# Patient Record
Sex: Female | Born: 1980 | Race: White | Hispanic: No | Marital: Married | Smoking: Never smoker
Health system: Southern US, Community
[De-identification: ages and names within clinical notes are randomized; demographics above are authoritative.]

## PROBLEM LIST (undated history)

## (undated) DIAGNOSIS — L719 Rosacea, unspecified: Secondary | ICD-10-CM

## (undated) HISTORY — DX: Rosacea, unspecified: L71.9

---

## 2008-12-25 ENCOUNTER — Ambulatory Visit: Payer: Self-pay | Admitting: Internal Medicine

## 2014-11-23 ENCOUNTER — Other Ambulatory Visit: Payer: Self-pay | Admitting: Obstetrics and Gynecology

## 2014-11-23 DIAGNOSIS — Z1231 Encounter for screening mammogram for malignant neoplasm of breast: Secondary | ICD-10-CM

## 2014-12-21 ENCOUNTER — Ambulatory Visit: Payer: Self-pay | Attending: Obstetrics and Gynecology

## 2015-03-13 ENCOUNTER — Ambulatory Visit
Admission: RE | Admit: 2015-03-13 | Discharge: 2015-03-13 | Disposition: A | Discharge status: Home / Self Care | Payer: BLUE CROSS/BLUE SHIELD | Source: Ambulatory Visit | Attending: Obstetrics and Gynecology | Admitting: Obstetrics and Gynecology

## 2015-03-13 DIAGNOSIS — Z1231 Encounter for screening mammogram for malignant neoplasm of breast: Secondary | ICD-10-CM | POA: Insufficient documentation

## 2018-10-19 ENCOUNTER — Ambulatory Visit: Payer: Self-pay | Admitting: Podiatry

## 2019-02-16 ENCOUNTER — Encounter: Payer: Self-pay | Admitting: Emergency Medicine

## 2019-02-16 ENCOUNTER — Emergency Department
Admission: EM | Admit: 2019-02-16 | Discharge: 2019-02-16 | Disposition: A | Discharge status: Home / Self Care | Payer: BC Managed Care – PPO | Attending: Emergency Medicine | Admitting: Emergency Medicine

## 2019-02-16 ENCOUNTER — Other Ambulatory Visit: Payer: Self-pay

## 2019-02-16 DIAGNOSIS — Y999 Unspecified external cause status: Secondary | ICD-10-CM | POA: Insufficient documentation

## 2019-02-16 DIAGNOSIS — S61012A Laceration without foreign body of left thumb without damage to nail, initial encounter: Secondary | ICD-10-CM | POA: Diagnosis present

## 2019-02-16 DIAGNOSIS — Y929 Unspecified place or not applicable: Secondary | ICD-10-CM | POA: Insufficient documentation

## 2019-02-16 DIAGNOSIS — Z23 Encounter for immunization: Secondary | ICD-10-CM | POA: Diagnosis not present

## 2019-02-16 DIAGNOSIS — W268XXA Contact with other sharp object(s), not elsewhere classified, initial encounter: Secondary | ICD-10-CM | POA: Diagnosis not present

## 2019-02-16 DIAGNOSIS — Y939 Activity, unspecified: Secondary | ICD-10-CM | POA: Insufficient documentation

## 2019-02-16 MED ORDER — TETANUS-DIPHTH-ACELL PERTUSSIS 5-2.5-18.5 LF-MCG/0.5 IM SUSP
0.5000 mL | Freq: Once | INTRAMUSCULAR | Status: AC
  Administered 2019-02-16: 21:00:00 0.5 mL via INTRAMUSCULAR
  Filled 2019-02-16: qty 0.5

## 2019-02-16 MED ORDER — OXYCODONE-ACETAMINOPHEN 5-325 MG PO TABS
1.0000 | ORAL_TABLET | Freq: Once | ORAL | Status: AC
  Administered 2019-02-16: 21:00:00 1 via ORAL
  Filled 2019-02-16: qty 1

## 2019-02-16 MED ORDER — CEPHALEXIN 500 MG PO CAPS: 500.0000 mg | ORAL_CAPSULE | Freq: Four times a day (QID) | ORAL | 0 refills | Status: AC

## 2019-02-16 MED ORDER — CEPHALEXIN 500 MG PO CAPS
500.0000 mg | ORAL_CAPSULE | Freq: Once | ORAL | Status: AC
  Administered 2019-02-16: 500 mg via ORAL
  Filled 2019-02-16: qty 1

## 2019-02-16 MED ORDER — LIDOCAINE HCL (PF) 1 % IJ SOLN
INTRAMUSCULAR | Status: AC
  Filled 2019-02-16: qty 5

## 2019-02-16 MED ORDER — LIDOCAINE HCL (PF) 1 % IJ SOLN
5.0000 mL | Freq: Once | INTRAMUSCULAR | Status: DC
  Filled 2019-02-16: qty 5

## 2019-02-16 NOTE — ED Notes (Signed)
Pt to the er for a lac to the anterior left thumb. Pt was opening a can and cut her thumb. Pt says wound has had continuous steady bleeding since it occurred at 1700 despite pressure being held. Lac is approx 1/2 inch long with a steady flow of blood. Edges are straight and approximate. Tetanus is unknown.

## 2019-02-16 NOTE — ED Provider Notes (Signed)
[facility] Emergency Department Provider Note  ____________________________________________  Time seen: Approximately 7:36 PM  I have reviewed the triage vital signs and the nursing notes.   HISTORY  Chief Complaint Extremity Laceration    HPI Jill Garrett is a 38 y.o. female that presents to the emergency department for evaluation of thumb laceration.  Patient cut her thumb on a tin can.  She is unsure of last tetanus.   History reviewed. No pertinent past medical history.  There are no active problems to display for this patient.   History reviewed. No pertinent surgical history.  Prior to Admission medications   Medication Sig Start Date End Date Taking? Authorizing Provider  cephALEXin (KEFLEX) 500 MG capsule Take 1 capsule (500 mg total) by mouth 4 (four) times daily for 10 days. 02/16/19 02/26/19  Laban Emperor, PA-C    Allergies Patient has no known allergies.  History reviewed. No pertinent family history.  Social History Social History   Tobacco Use  . Smoking status: Never Smoker  . Smokeless tobacco: Never Used  Substance Use Topics  . Alcohol use: Not Currently  . Drug use: Not on file     Review of Systems   Gastrointestinal: No abdominal pain.  No nausea, no vomiting.  Musculoskeletal: Positive for thumb pain. Skin: Negative for rash, ecchymosis. Positive for laceration. Neurological: Negative for headaches, numbness or tingling   ____________________________________________   PHYSICAL EXAM:  VITAL SIGNS: ED Triage Vitals  Enc Vitals Group     BP 02/16/19 1820 (!) 130/97     Pulse Rate 02/16/19 1820 96     Resp --      Temp 02/16/19 1820 98.5 F (36.9 C)     Temp Source 02/16/19 1820 Oral     SpO2 02/16/19 1820 98 %     Weight 02/16/19 1822 200 lb (90.7 kg)     Height 02/16/19 1822 5\' 6"  (1.676 m)     Head Circumference --      Peak Flow --      Pain Score 02/16/19 1821 8     Pain Loc --      Pain Edu?  --      Excl. in [place]? --      Constitutional: Alert and oriented. Well appearing and in no acute distress. Eyes: Conjunctivae are normal. PERRL. EOMI. Head: Atraumatic. ENT:      Ears:      Nose: No congestion/rhinnorhea.      Mouth/Throat: Mucous membranes are moist.  Neck: No stridor.  Cardiovascular: Normal rate, regular rhythm.  Good peripheral circulation. Respiratory: Normal respiratory effort without tachypnea or retractions. Lungs CTAB. Good air entry to the bases with no decreased or absent breath sounds. Musculoskeletal: Full range of motion to all extremities. No gross deformities appreciated.  Able to perform resisted flexion and extension of thumb. Neurologic:  Normal speech and language. No gross focal neurologic deficits are appreciated.  Skin:  Skin is warm, dry.  1 inch laceration to mid volar thumb. Psychiatric: Mood and affect are normal. Speech and behavior are normal. Patient exhibits appropriate insight and judgement.   ____________________________________________   LABS (all labs ordered are listed, but only abnormal results are displayed)  Labs Reviewed - No data to display ____________________________________________  EKG   ____________________________________________  RADIOLOGY   No results found.  ____________________________________________    PROCEDURES  Procedure(s) performed:    Procedures  LACERATION REPAIR Performed by: QVZDG PA-S  Consent: Verbal consent obtained.  Consent given  by: patient  Prepped and Draped in normal sterile fashion  Wound explored: No foreign bodies   Laceration Location: thumb  Laceration Length: 1inch  Anesthesia: None  Local anesthetic: lidocaine 1% without epinephrine  Anesthetic total: 4  ml  Irrigation method: syringe  Amount of cleaning: 529ml normal saline  Skin closure: 4-0 nylon  Number of sutures: 9  Technique: Simple interrupted  Patient tolerance: Patient tolerated the  procedure well with no immediate complications.  Medications  lidocaine (PF) (XYLOCAINE) 1 % injection 5 mL (has no administration in time range)  lidocaine (PF) (XYLOCAINE) 1 % injection (has no administration in time range)  Tdap (BOOSTRIX) injection 0.5 mL (has no administration in time range)  cephALEXin (KEFLEX) capsule 500 mg (has no administration in time range)  oxyCODONE-acetaminophen (PERCOCET/ROXICET) 5-325 MG per tablet 1 tablet (has no administration in time range)     ____________________________________________   INITIAL IMPRESSION / ASSESSMENT AND PLAN / ED COURSE  Pertinent labs & imaging results that were available during my care of the patient were reviewed by me and considered in my medical decision making (see chart for details).  Review of the [place] CSRS was performed in accordance of the [place] prior to dispensing any controlled drugs.     Patient presented to emergency department for evaluation of thumb laceration.  Vital signs and exam are reassuring.  Laceration was repaired with stitches by PA student.  Laceration was bandaged and splint was placed.  Patient was given Keflex in the ED to prevent infection.  Tetanus shot was updated.  Patient will be discharged home with prescriptions for Keflex. Patient is to follow up with primary care as directed. Patient is given ED precautions to return to the ED for any worsening or new symptoms.     ____________________________________________  FINAL CLINICAL IMPRESSION(S) / ED DIAGNOSES  Final diagnoses:  Laceration of left thumb without foreign body without damage to nail, initial encounter      NEW MEDICATIONS STARTED DURING THIS VISIT:  ED Discharge Orders         Ordered    cephALEXin (KEFLEX) 500 MG capsule  4 times daily     02/16/19 2052              This chart was dictated using voice recognition software/Dragon. Despite best efforts to proofread, errors can occur which can change the meaning.  Any change was purely unintentional.    Laban Emperor, PA-C 02/16/19 2103    Delman Kitten, MD 02/16/19 2110

## 2019-02-16 NOTE — ED Triage Notes (Signed)
Pt with lac on bottom of left thumb. Bleeding controlled.

## 2019-04-18 ENCOUNTER — Other Ambulatory Visit: Payer: Self-pay | Admitting: Obstetrics and Gynecology

## 2019-04-18 DIAGNOSIS — Z1231 Encounter for screening mammogram for malignant neoplasm of breast: Secondary | ICD-10-CM

## 2019-05-24 ENCOUNTER — Ambulatory Visit
Admission: RE | Admit: 2019-05-24 | Discharge: 2019-05-24 | Disposition: A | Discharge status: Home / Self Care | Payer: BC Managed Care – PPO | Source: Ambulatory Visit | Attending: Obstetrics and Gynecology | Admitting: Obstetrics and Gynecology

## 2019-05-24 DIAGNOSIS — Z1231 Encounter for screening mammogram for malignant neoplasm of breast: Secondary | ICD-10-CM | POA: Diagnosis not present

## 2019-10-18 ENCOUNTER — Other Ambulatory Visit: Payer: Self-pay | Admitting: Dermatology

## 2019-11-15 ENCOUNTER — Other Ambulatory Visit: Payer: Self-pay

## 2019-11-15 ENCOUNTER — Ambulatory Visit: Payer: BC Managed Care – PPO | Admitting: Dermatology

## 2019-11-15 DIAGNOSIS — L719 Rosacea, unspecified: Secondary | ICD-10-CM

## 2019-11-15 MED ORDER — DOXYCYCLINE 40 MG PO CPDR: DELAYED_RELEASE_CAPSULE | ORAL | 11 refills | Status: DC

## 2019-11-15 MED ORDER — SOOLANTRA 1 % EX CREA: 1.0000 "application " | TOPICAL_CREAM | Freq: Every day | CUTANEOUS | 11 refills | Status: DC

## 2019-11-15 NOTE — Progress Notes (Signed)
   Follow-Up Visit   Subjective  Jill Garrett is a 39 y.o. female who presents for the following: Rosacea (Improved with Soolantra Cream and Oracea 40mg  QD.).  Patient is very happy with results.   The following portions of the chart were reviewed this encounter and updated as appropriate:     Review of Systems: No other skin or systemic complaints.  Objective  Well appearing patient in no apparent distress; mood and affect are within normal limits.  A focused examination was performed including face. Relevant physical exam findings are noted in the Assessment and Plan.  Objective  Malar Cheeks, Nose: Mild erythema with telangiectasias central face  Assessment & Plan  Rosacea Malar Cheeks, Nose  Well controlled Continue Soolantra Cream QHS. Continue Oracea 40mg  1 po QD.  Recommend daily broad spectrum sunscreen SPF 30+ to sun-exposed areas, reapply every 2 hours as needed.  Sample of [place] MD UV Clear tinted given. Discussed BBL for fall  Return in about 6 months (around 05/16/2020) for Rosacea.   IJamesetta Orleans, CMA, am acting as scribe for Brendolyn Patty, MD .

## 2020-04-16 ENCOUNTER — Other Ambulatory Visit: Payer: Self-pay | Admitting: Obstetrics and Gynecology

## 2020-04-17 ENCOUNTER — Other Ambulatory Visit: Payer: Self-pay | Admitting: Obstetrics and Gynecology

## 2020-04-30 ENCOUNTER — Ambulatory Visit: Payer: BC Managed Care – PPO | Admitting: Podiatry

## 2020-05-21 ENCOUNTER — Other Ambulatory Visit: Payer: Self-pay | Admitting: Obstetrics and Gynecology

## 2020-05-21 DIAGNOSIS — Z1231 Encounter for screening mammogram for malignant neoplasm of breast: Secondary | ICD-10-CM

## 2020-05-28 ENCOUNTER — Ambulatory Visit: Payer: BC Managed Care – PPO

## 2020-05-28 ENCOUNTER — Encounter: Payer: BC Managed Care – PPO | Admitting: Podiatry

## 2020-05-28 DIAGNOSIS — M722 Plantar fascial fibromatosis: Secondary | ICD-10-CM

## 2020-05-30 NOTE — Progress Notes (Signed)
This encounter was created in error - please disregard.

## 2020-06-19 ENCOUNTER — Ambulatory Visit
Admission: RE | Admit: 2020-06-19 | Discharge: 2020-06-19 | Disposition: A | Discharge status: Home / Self Care | Payer: BC Managed Care – PPO | Source: Ambulatory Visit | Attending: Obstetrics and Gynecology | Admitting: Obstetrics and Gynecology

## 2020-06-19 ENCOUNTER — Other Ambulatory Visit: Payer: Self-pay

## 2020-06-19 DIAGNOSIS — Z1231 Encounter for screening mammogram for malignant neoplasm of breast: Secondary | ICD-10-CM | POA: Diagnosis not present

## 2020-09-17 ENCOUNTER — Ambulatory Visit: Payer: BC Managed Care – PPO | Admitting: Dermatology

## 2020-11-19 ENCOUNTER — Other Ambulatory Visit: Payer: Self-pay

## 2020-11-19 ENCOUNTER — Other Ambulatory Visit: Payer: Self-pay | Admitting: Dermatology

## 2020-11-19 DIAGNOSIS — L719 Rosacea, unspecified: Secondary | ICD-10-CM

## 2020-11-19 MED ORDER — IVERMECTIN 1 % EX CREA: 1.0000 "application " | TOPICAL_CREAM | Freq: Every day | CUTANEOUS | 11 refills | Status: DC

## 2020-11-20 ENCOUNTER — Ambulatory Visit: Payer: BC Managed Care – PPO | Admitting: Dermatology

## 2020-12-19 ENCOUNTER — Other Ambulatory Visit: Payer: Self-pay | Admitting: Dermatology

## 2020-12-24 ENCOUNTER — Other Ambulatory Visit: Payer: Self-pay

## 2020-12-24 ENCOUNTER — Ambulatory Visit: Payer: BC Managed Care – PPO | Admitting: Dermatology

## 2020-12-24 DIAGNOSIS — D2372 Other benign neoplasm of skin of left lower limb, including hip: Secondary | ICD-10-CM

## 2020-12-24 DIAGNOSIS — L719 Rosacea, unspecified: Secondary | ICD-10-CM | POA: Diagnosis not present

## 2020-12-24 DIAGNOSIS — D489 Neoplasm of uncertain behavior, unspecified: Secondary | ICD-10-CM

## 2020-12-24 MED ORDER — DOXYCYCLINE 40 MG PO CPDR: 40.0000 mg | DELAYED_RELEASE_CAPSULE | ORAL | 11 refills | Status: AC

## 2020-12-24 NOTE — Progress Notes (Signed)
   Follow-Up Visit   Subjective  Jill Garrett is a 40 y.o. female who presents for the following: Rosacea (Patient here today for 1 year rosacea follow up. She uses Soolantra at bedtime and takes Oracea 40mg  once a day. Patient pleased with treatment. ).  Patient would also like spot checked at left leg, present for 5-6 years. It is bothersome and she would like it removed.  The following portions of the chart were reviewed this encounter and updated as appropriate:       Review of Systems:  No other skin or systemic complaints except as noted in HPI or Assessment and Plan.  Objective  Well appearing patient in no apparent distress; mood and affect are within normal limits.  A focused examination was performed including face, left leg. Relevant physical exam findings are noted in the Assessment and Plan.  Objective  face: Mild erythema at cheeks  Objective  Left Medial Thigh: Firm brown papule   Assessment & Plan  Rosacea face  Controlled Continue Soolantra QHS Continue Oracea 40mg  1 PO QD with food.   Doxycycline should be taken with food to prevent nausea. Do not lay down for 30 minutes after taking. Be cautious with sun exposure and use good sun protection while on this medication. Pregnant women should not take this medication.    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 Anesthesia: the lesion was anesthetized in a standard fashion   Anesthetic:  0.5% bupivicaine w/ epinephrine 1-100,000 local infiltration Instrument used: flexible razor blade   Hemostasis achieved with: pressure, aluminum chloride and electrodesiccation   Outcome: patient tolerated procedure  well   Post-procedure details: wound care instructions given   Post-procedure details comment:  Ointment and small bandage applied.   Specimen 1 - Surgical pathology Differential Diagnosis: Irritated Dermatofibroma vs other  Check Margins: No Firm brown papule  Return in about 1 year (around 12/24/2021) for Rosacea.  Graciella Belton, RMA, am acting as scribe for Brendolyn Patty, MD . Documentation: I have reviewed the above documentation for accuracy and completeness, and I agree with the above.  Brendolyn Patty MD

## 2020-12-24 NOTE — Patient Instructions (Addendum)

## 2021-01-01 ENCOUNTER — Telehealth: Payer: Self-pay

## 2021-01-01 NOTE — Telephone Encounter (Signed)
Advised pt of bx results/sh ?

## 2021-01-01 NOTE — Telephone Encounter (Signed)
-----   Message from Brendolyn Patty, MD sent at 12/31/2020  3:26 PM EDT ----- Skin , left medial thigh DERMATOFIBROMA, PERIPHERAL AND DEEP MARGINS INVOLVED  Benign DF

## 2021-01-28 ENCOUNTER — Other Ambulatory Visit: Payer: Self-pay | Admitting: Dermatology

## 2021-04-10 ENCOUNTER — Other Ambulatory Visit: Payer: Self-pay

## 2021-04-10 ENCOUNTER — Inpatient Hospital Stay: Payer: BC Managed Care – PPO

## 2021-04-10 ENCOUNTER — Encounter: Payer: Self-pay | Admitting: Anesthesiology

## 2021-04-10 ENCOUNTER — Encounter
Admission: EM | Disposition: A | Discharge status: Home / Self Care | Payer: Self-pay | Source: Home / Self Care | Attending: Family Medicine

## 2021-04-10 ENCOUNTER — Emergency Department: Payer: BC Managed Care – PPO

## 2021-04-10 ENCOUNTER — Inpatient Hospital Stay
Admission: EM | Admit: 2021-04-10 | Discharge: 2021-04-11 | DRG: 563 | Disposition: A | Discharge status: Home / Self Care | Payer: BC Managed Care – PPO | Attending: Internal Medicine | Admitting: Internal Medicine

## 2021-04-10 DIAGNOSIS — S82851A Displaced trimalleolar fracture of right lower leg, initial encounter for closed fracture: Secondary | ICD-10-CM | POA: Diagnosis present

## 2021-04-10 DIAGNOSIS — Z20822 Contact with and (suspected) exposure to covid-19: Secondary | ICD-10-CM | POA: Diagnosis present

## 2021-04-10 DIAGNOSIS — I10 Essential (primary) hypertension: Secondary | ICD-10-CM | POA: Diagnosis present

## 2021-04-10 DIAGNOSIS — X501XXA Overexertion from prolonged static or awkward postures, initial encounter: Secondary | ICD-10-CM

## 2021-04-10 DIAGNOSIS — S9304XA Dislocation of right ankle joint, initial encounter: Principal | ICD-10-CM

## 2021-04-10 DIAGNOSIS — M25571 Pain in right ankle and joints of right foot: Secondary | ICD-10-CM | POA: Diagnosis present

## 2021-04-10 DIAGNOSIS — W19XXXA Unspecified fall, initial encounter: Secondary | ICD-10-CM | POA: Diagnosis not present

## 2021-04-10 DIAGNOSIS — S82891A Other fracture of right lower leg, initial encounter for closed fracture: Secondary | ICD-10-CM | POA: Diagnosis present

## 2021-04-10 LAB — COMPREHENSIVE METABOLIC PANEL
ALT: 20 U/L (ref 0–44)
AST: 18 U/L (ref 15–41)
Albumin: 3.9 g/dL (ref 3.5–5.0)
Alkaline Phosphatase: 78 U/L (ref 38–126)
Anion gap: 7 (ref 5–15)
BUN: 13 mg/dL (ref 6–20)
CO2: 27 mmol/L (ref 22–32)
Calcium: 8.7 mg/dL — ABNORMAL LOW (ref 8.9–10.3)
Chloride: 106 mmol/L (ref 98–111)
Creatinine, Ser: 0.75 mg/dL (ref 0.44–1.00)
GFR, Estimated: >60 mL/min (ref >60)
Glucose, Bld: 117 mg/dL — ABNORMAL HIGH (ref 70–99)
Potassium: 4 mmol/L (ref 3.5–5.1)
Sodium: 140 mmol/L (ref 135–145)
Total Bilirubin: 0.3 mg/dL (ref 0.3–1.2)
Total Protein: 7.4 g/dL (ref 6.5–8.1)

## 2021-04-10 LAB — CBC
HCT: 37.5 % (ref 36.0–46.0)
Hemoglobin: 12.5 g/dL (ref 12.0–15.0)
MCH: 28.3 pg (ref 26.0–34.0)
MCHC: 33.3 g/dL (ref 30.0–36.0)
MCV: 85 fL (ref 80.0–100.0)
Platelets: 306 10*3/uL (ref 150–400)
RBC: 4.41 MIL/uL (ref 3.87–5.11)
RDW: 13.8 % (ref 11.5–15.5)
WBC: 14.1 10*3/uL — ABNORMAL HIGH (ref 4.0–10.5)
nRBC: 0 % (ref 0.0–0.2)

## 2021-04-10 LAB — CBC WITH DIFFERENTIAL/PLATELET
Abs Immature Granulocytes: 0.03 10*3/uL (ref 0.00–0.07)
Basophils Absolute: 0 10*3/uL (ref 0.0–0.1)
Basophils Relative: 0 %
Eosinophils Absolute: 0.3 10*3/uL (ref 0.0–0.5)
Eosinophils Relative: 3 %
HCT: 38.9 % (ref 36.0–46.0)
Hemoglobin: 12.8 g/dL (ref 12.0–15.0)
Immature Granulocytes: 0 %
Lymphocytes Relative: 23 %
Lymphs Abs: 2.4 10*3/uL (ref 0.7–4.0)
MCH: 28.1 pg (ref 26.0–34.0)
MCHC: 32.9 g/dL (ref 30.0–36.0)
MCV: 85.5 fL (ref 80.0–100.0)
Monocytes Absolute: 0.8 10*3/uL (ref 0.1–1.0)
Monocytes Relative: 7 %
Neutro Abs: 7.3 10*3/uL (ref 1.7–7.7)
Neutrophils Relative %: 67 %
Platelets: 291 10*3/uL (ref 150–400)
RBC: 4.55 MIL/uL (ref 3.87–5.11)
RDW: 13.6 % (ref 11.5–15.5)
WBC: 10.9 10*3/uL — ABNORMAL HIGH (ref 4.0–10.5)
nRBC: 0 % (ref 0.0–0.2)

## 2021-04-10 LAB — BASIC METABOLIC PANEL
Anion gap: 7 (ref 5–15)
BUN: 11 mg/dL (ref 6–20)
CO2: 24 mmol/L (ref 22–32)
Calcium: 8.5 mg/dL — ABNORMAL LOW (ref 8.9–10.3)
Chloride: 107 mmol/L (ref 98–111)
Creatinine, Ser: 0.67 mg/dL (ref 0.44–1.00)
GFR, Estimated: >60 mL/min (ref >60)
Glucose, Bld: 122 mg/dL — ABNORMAL HIGH (ref 70–99)
Potassium: 4.3 mmol/L (ref 3.5–5.1)
Sodium: 138 mmol/L (ref 135–145)

## 2021-04-10 LAB — SURGICAL PCR SCREEN
MRSA, PCR: NEGATIVE
Staphylococcus aureus: NEGATIVE

## 2021-04-10 LAB — HIV ANTIBODY (ROUTINE TESTING W REFLEX): HIV Screen 4th Generation wRfx: NONREACTIVE

## 2021-04-10 LAB — VITAMIN D 25 HYDROXY (VIT D DEFICIENCY, FRACTURES): Vit D, 25-Hydroxy: 18.51 ng/mL — ABNORMAL LOW (ref 30–100)

## 2021-04-10 LAB — RESP PANEL BY RT-PCR (FLU A&B, COVID) ARPGX2
Influenza A by PCR: NEGATIVE
Influenza B by PCR: NEGATIVE
SARS Coronavirus 2 by RT PCR: NEGATIVE

## 2021-04-10 LAB — ETHANOL: Alcohol, Ethyl (B): 21 mg/dL — ABNORMAL HIGH (ref <10)

## 2021-04-10 SURGERY — OPEN REDUCTION INTERNAL FIXATION (ORIF) ANKLE FRACTURE
Anesthesia: Choice | Site: Ankle | Laterality: Right

## 2021-04-10 MED ORDER — TRAZODONE HCL 50 MG PO TABS: 25.0000 mg | ORAL_TABLET | Freq: Every evening | ORAL | Status: DC | PRN

## 2021-04-10 MED ORDER — PROPOFOL 10 MG/ML IV BOLUS
INTRAVENOUS | Status: AC | PRN
  Administered 2021-04-10: 100 mg via INTRAVENOUS

## 2021-04-10 MED ORDER — FENTANYL CITRATE PF 50 MCG/ML IJ SOSY
50.0000 ug | PREFILLED_SYRINGE | Freq: Once | INTRAMUSCULAR | Status: AC
Start: 2021-04-10 — End: 2021-04-10
  Administered 2021-04-10: 50 ug via INTRAVENOUS
  Filled 2021-04-10: qty 1

## 2021-04-10 MED ORDER — PROPOFOL 10 MG/ML IV BOLUS
INTRAVENOUS | Status: AC
  Filled 2021-04-10: qty 20

## 2021-04-10 MED ORDER — ETOMIDATE 2 MG/ML IV SOLN
INTRAVENOUS | Status: AC | PRN
  Administered 2021-04-10: 16 mg via INTRAVENOUS

## 2021-04-10 MED ORDER — SODIUM CHLORIDE 0.9 % IV SOLN
INTRAVENOUS | Status: AC | PRN
  Administered 2021-04-10: 500 mL via INTRAVENOUS

## 2021-04-10 MED ORDER — MORPHINE SULFATE (PF) 2 MG/ML IV SOLN: 2.0000 mg | INTRAVENOUS | Status: DC | PRN

## 2021-04-10 MED ORDER — ACETAMINOPHEN 650 MG RE SUPP: 650.0000 mg | Freq: Four times a day (QID) | RECTAL | Status: DC | PRN

## 2021-04-10 MED ORDER — HYDROMORPHONE HCL 1 MG/ML IJ SOLN
0.5000 mg | Freq: Once | INTRAMUSCULAR | Status: AC
  Administered 2021-04-10: 0.5 mg via INTRAVENOUS
  Filled 2021-04-10: qty 1

## 2021-04-10 MED ORDER — ONDANSETRON HCL 4 MG PO TABS: 4.0000 mg | ORAL_TABLET | Freq: Four times a day (QID) | ORAL | Status: DC | PRN

## 2021-04-10 MED ORDER — ONDANSETRON HCL 4 MG/2ML IJ SOLN
4.0000 mg | Freq: Once | INTRAMUSCULAR | Status: AC
  Administered 2021-04-10: 4 mg via INTRAVENOUS
  Filled 2021-04-10: qty 2

## 2021-04-10 MED ORDER — CALCIUM CARBONATE-VITAMIN D 500-200 MG-UNIT PO TABS: 1.0000 | ORAL_TABLET | Freq: Two times a day (BID) | ORAL | Status: DC

## 2021-04-10 MED ORDER — SODIUM CHLORIDE 0.9 % IV SOLN: INTRAVENOUS | Status: DC

## 2021-04-10 MED ORDER — MAGNESIUM HYDROXIDE 400 MG/5ML PO SUSP: 30.0000 mL | Freq: Every day | ORAL | Status: DC | PRN

## 2021-04-10 MED ORDER — OXYCODONE HCL 5 MG PO TABS
5.0000 mg | ORAL_TABLET | ORAL | Status: DC | PRN
  Administered 2021-04-10 – 2021-04-11 (×6): 5 mg via ORAL
  Filled 2021-04-10 (×7): qty 1

## 2021-04-10 MED ORDER — CALCIUM CARBONATE ANTACID 500 MG PO CHEW
1.0000 | CHEWABLE_TABLET | Freq: Two times a day (BID) | ORAL | Status: DC
  Administered 2021-04-11: 200 mg via ORAL
  Filled 2021-04-10: qty 1

## 2021-04-10 MED ORDER — ONDANSETRON HCL 4 MG/2ML IJ SOLN
4.0000 mg | Freq: Four times a day (QID) | INTRAMUSCULAR | Status: DC | PRN
  Administered 2021-04-11: 4 mg via INTRAVENOUS
  Filled 2021-04-10: qty 2

## 2021-04-10 MED ORDER — VITAMIN D (ERGOCALCIFEROL) 1.25 MG (50000 UNIT) PO CAPS
50000.0000 [IU] | ORAL_CAPSULE | Freq: Once | ORAL | Status: AC
  Administered 2021-04-10: 50000 [IU] via ORAL
  Filled 2021-04-10: qty 1

## 2021-04-10 MED ORDER — ACETAMINOPHEN 325 MG PO TABS: 650.0000 mg | ORAL_TABLET | Freq: Four times a day (QID) | ORAL | Status: DC | PRN

## 2021-04-10 MED ORDER — ETOMIDATE 2 MG/ML IV SOLN
10.0000 mg | Freq: Once | INTRAVENOUS | Status: AC
  Administered 2021-04-10: 10 mg via INTRAVENOUS
  Filled 2021-04-10: qty 10

## 2021-04-10 MED ORDER — PROPOFOL 10 MG/ML IV BOLUS
INTRAVENOUS | Status: AC | PRN
  Administered 2021-04-10: 50 mg via INTRAVENOUS

## 2021-04-10 MED ORDER — LABETALOL HCL 5 MG/ML IV SOLN: 20.0000 mg | INTRAVENOUS | Status: DC | PRN

## 2021-04-10 SURGICAL SUPPLY — 41 items
BNDG COHESIVE 6X5 TAN ST LF (GAUZE/BANDAGES/DRESSINGS) ×2 IMPLANT
BNDG ELASTIC 6X5.8 VLCR STR LF (GAUZE/BANDAGES/DRESSINGS) ×2 IMPLANT
BNDG ESMARK 6X12 TAN STRL LF (GAUZE/BANDAGES/DRESSINGS) ×2 IMPLANT
BRUSH SCRUB EZ  4% CHG (MISCELLANEOUS) ×2
BRUSH SCRUB EZ 4% CHG (MISCELLANEOUS) ×2 IMPLANT
CHLORAPREP W/TINT 26 (MISCELLANEOUS) ×4 IMPLANT
CUFF TOURN SGL QUICK 24 (TOURNIQUET CUFF)
CUFF TOURN SGL QUICK 34 (TOURNIQUET CUFF)
CUFF TRNQT CYL 24X4X16.5-23 (TOURNIQUET CUFF) IMPLANT
CUFF TRNQT CYL 34X4.125X (TOURNIQUET CUFF) IMPLANT
DRAPE 3/4 80X56 (DRAPES) ×2 IMPLANT
DRAPE FLUOR MINI C-ARM 54X84 (DRAPES) ×2 IMPLANT
ELECT REM PT RETURN 9FT ADLT (ELECTROSURGICAL) ×2
ELECTRODE REM PT RTRN 9FT ADLT (ELECTROSURGICAL) ×1 IMPLANT
GAUZE 4X4 16PLY ~~LOC~~+RFID DBL (SPONGE) ×2 IMPLANT
GAUZE XEROFORM 1X8 LF (GAUZE/BANDAGES/DRESSINGS) ×4 IMPLANT
GLOVE SURG ORTHO LTX SZ8 (GLOVE) ×6 IMPLANT
GLOVE SURG UNDER LTX SZ8 (GLOVE) ×6 IMPLANT
GOWN STRL REUS W/ TWL LRG LVL3 (GOWN DISPOSABLE) ×1 IMPLANT
GOWN STRL REUS W/ TWL XL LVL3 (GOWN DISPOSABLE) ×1 IMPLANT
GOWN STRL REUS W/TWL LRG LVL3 (GOWN DISPOSABLE) ×1
GOWN STRL REUS W/TWL XL LVL3 (GOWN DISPOSABLE) ×1
KIT TURNOVER KIT A (KITS) ×2 IMPLANT
MANIFOLD NEPTUNE II (INSTRUMENTS) ×2 IMPLANT
NS IRRIG 1000ML POUR BTL (IV SOLUTION) ×2 IMPLANT
PACK EXTREMITY ARMC (MISCELLANEOUS) ×2 IMPLANT
PAD ABD DERMACEA PRESS 5X9 (GAUZE/BANDAGES/DRESSINGS) ×8 IMPLANT
PAD CAST CTTN 4X4 STRL (SOFTGOODS) ×2 IMPLANT
PADDING CAST COTTON 4X4 STRL (SOFTGOODS) ×2
SCALPEL PROTECTED #10 DISP (BLADE) ×2 IMPLANT
SCALPEL PROTECTED #15 DISP (BLADE) ×2 IMPLANT
SPLINT CAST 1 STEP 5X30 WHT (MISCELLANEOUS) ×2 IMPLANT
SPONGE T-LAP 18X18 ~~LOC~~+RFID (SPONGE) ×2 IMPLANT
STAPLER SKIN PROX 35W (STAPLE) ×2 IMPLANT
SUT VIC AB 2-0 CT2 27 (SUTURE) ×2 IMPLANT
SUT VIC AB 3-0 SH 27 (SUTURE) ×1
SUT VIC AB 3-0 SH 27X BRD (SUTURE) ×1 IMPLANT
TAPE SURG TRANSPORE 1 IN (GAUZE/BANDAGES/DRESSINGS) ×1 IMPLANT
TAPE SURGICAL TRANSPORE 1 IN (GAUZE/BANDAGES/DRESSINGS) ×1
TOWEL OR 17X26 4PK STRL BLUE (TOWEL DISPOSABLE) ×4 IMPLANT
WATER STERILE IRR 500ML POUR (IV SOLUTION) ×2 IMPLANT

## 2021-04-10 NOTE — ED Provider Notes (Signed)
[facility] Emergency Department Provider Note   ____________________________________________   Event Date/Time   First MD Initiated Contact with Patient 04/10/21 0104     (approximate)  I have reviewed the triage vital signs and the nursing notes.   HISTORY  Chief Complaint right ankle injury    HPI Jill Garrett is a 40 y.o. female brought to the ED via EMS from home status post fall with right ankle injury.  Patient reports she got out of bed and rolled her ankle, landing on her right ankle.  Denies striking head or LOC.  Last ate or drank approximately 9:30 PM.  Did have some whiskey last night.  Denies headache, vision changes, neck pain, chest pain, shortness of breath, abdominal pain, nausea, vomiting or dizziness.  Arrives and EMS splint; received 100 mcg Fentanyl en route.    Past Medical History:  Diagnosis Date   Rosacea     Patient Active Problem List   Diagnosis Date Noted   Closed right ankle fracture, initial encounter 04/10/2021    Past Surgical History:  Procedure Laterality Date   CESAREAN SECTION      Prior to Admission medications   Medication Sig Start Date End Date Taking? Authorizing Provider  doxycycline (ORACEA) 40 MG capsule Take 1 capsule (40 mg total) by mouth every morning. 12/24/20   Brendolyn Patty, MD    Allergies Patient has no known allergies.  History reviewed. No pertinent family history.  Social History Social History   Tobacco Use   Smoking status: Never   Smokeless tobacco: Never  Substance Use Topics   Alcohol use: Yes    Comment: Two mixed drinks tonight   Drug use: Never    Review of Systems  Constitutional: No fever/chills Eyes: No visual changes. ENT: No sore throat. Cardiovascular: Denies chest pain. Respiratory: Denies shortness of breath. Gastrointestinal: No abdominal pain.  No nausea, no vomiting.  No diarrhea.  No constipation. Genitourinary: Negative for  dysuria. Musculoskeletal: Positive for right ankle pain and injury.  Negative for back pain. Skin: Negative for rash. Neurological: Negative for headaches, focal weakness or numbness.   ____________________________________________   PHYSICAL EXAM:  VITAL SIGNS: ED Triage Vitals [04/10/21 0101]  Enc Vitals Group     BP      Pulse      Resp      Temp      Temp src      SpO2 99 %     Weight      Height      Head Circumference      Peak Flow      Pain Score      Pain Loc      Pain Edu?      Excl. in [place]?     Constitutional: Alert and oriented. Well appearing and in mild to moderate acute distress. Eyes: Conjunctivae are normal. PERRL. EOMI. Head: Atraumatic. Nose: Atraumatic. Mouth/Throat: Mucous membranes are moist.  No dental malocclusion. Neck: No stridor.  No cervical spine tenderness to palpation. Cardiovascular: Normal rate, regular rhythm. Grossly normal heart sounds.  Good peripheral circulation. Respiratory: Normal respiratory effort.  No retractions. Lungs CTAB. Gastrointestinal: Soft and nontender to light or deep palpation. No distention. No abdominal bruits. No CVA tenderness. Musculoskeletal: No spinal tenderness to palpation.  Pelvis is stable.  Right ankle with deformity and skin tenting.  Palpable distal pulses.  Brisk, less than 5-second capillary refill.  Able to wiggle toes freely.   Neurologic:  Normal  speech and language. No gross focal neurologic deficits are appreciated.  Skin:  Skin is warm, dry and intact. No rash noted. Psychiatric: Mood and affect are normal. Speech and behavior are normal.  ____________________________________________   LABS (all labs ordered are listed, but only abnormal results are displayed)  Labs Reviewed  CBC WITH DIFFERENTIAL/PLATELET - Abnormal; Notable for the following components:      Result Value   WBC 10.9 (*)    All other components within normal limits  ETHANOL - Abnormal; Notable for the following  components:   Alcohol, Ethyl (B) 21 (*)    All other components within normal limits  COMPREHENSIVE METABOLIC PANEL - Abnormal; Notable for the following components:   Glucose, Bld 117 (*)    Calcium 8.7 (*)    All other components within normal limits  RESP PANEL BY RT-PCR (FLU A&B, COVID) ARPGX2  HIV ANTIBODY (ROUTINE TESTING W REFLEX)  BASIC METABOLIC PANEL  CBC   ____________________________________________  EKG  None ____________________________________________  RADIOLOGY I, Hamdi Vari J, personally viewed and evaluated these images (plain radiographs) as part of my medical decision making, as well as reviewing the written report by the radiologist.  ED MD interpretation: Trimalleolar fracture/dislocation, no foot fracture; post reduction film demonstrates reduction of dislocation, trimalleolar fracture with continued displacement  Official radiology report(s): DG Ankle 2 Views Right  Result Date: 04/10/2021 CLINICAL DATA:  Postreduction EXAM: RIGHT ANKLE - 2 VIEW COMPARISON:  04/10/2021 FINDINGS: Interval reduction of the previously seen dislocation of the right ankle. Continued significant displacement across the comminuted distal fibular metadiaphyseal fractures, posterior malleolar fracture and medial malleolar fractures. IMPRESSION: Interval reduction of the dislocated right ankle. Trimalleolar fracture with continued significant displacement. Electronically Signed   By: Rolm Baptise M.D.   On: 04/10/2021 03:24   DG Ankle Complete Right  Result Date: 04/10/2021 CLINICAL DATA:  Fall, right ankle injury EXAM: RIGHT ANKLE - COMPLETE 3+ VIEW COMPARISON:  None. FINDINGS: Three view radiograph right ankle demonstrates a a comminuted trimalleolar fracture dislocation of the right ankle. A comminuted, segmental fracture of the distal right fibula is seen with a free-floating segment comprised of the distal diaphysis with 1/2 shaft with posterior displacement, override and moderate  posterolateral angulation of the distal fracture fragment. A a posterior malleolar fracturel fragment is identified demonstrating roughly 7 mm superior displacement and 1.5 cm lateral displacement of the fracture fragment which comprises roughly 25% of the articular surface. There is a avulsion type medial malleolar fracture at the level of the tibial plafond with the medial malleolar fracture fragment demonstrating lateral displacement by approximately 1.5 cm and mild lateral angulation. There is posterolateral dislocation of the talar dome in relation to the tibial plafond. IMPRESSION: Trimalleolar fracture dislocation of the right ankle as described above. Electronically Signed   By: Fidela Salisbury M.D.   On: 04/10/2021 01:51   DG Foot 2 Views Right  Result Date: 04/10/2021 CLINICAL DATA:  Fall, right ankle injury EXAM: RIGHT FOOT - 2 VIEW COMPARISON:  None. FINDINGS: Trimalleolar right ankle fracture dislocation is better assessed on accompanying radiographs of the right ankle. Normal alignment of the bones of the right foot. No fracture or dislocation. Joint spaces are preserved. Soft tissues are unremarkable IMPRESSION: No fracture or dislocation involving the bones of the right foot. Electronically Signed   By: Fidela Salisbury M.D.   On: 04/10/2021 01:52    ____________________________________________   PROCEDURES  Procedure(s) performed (including Critical Care):  .Sedation  Date/Time: 04/10/2021 3:03 AM Performed  by: Paulette Blanch, MD Authorized by: Paulette Blanch, MD   Consent:    Consent obtained:  Verbal and written   Consent given by:  Patient   Risks discussed:  Allergic reaction, prolonged hypoxia resulting in organ damage, dysrhythmia, prolonged sedation necessitating reversal, inadequate sedation, respiratory compromise necessitating ventilatory assistance and intubation, nausea and vomiting Universal protocol:    Immediately prior to procedure, a time out was called: yes    Indications:    Procedure performed:  Dislocation reduction   Procedure necessitating sedation performed by:  Physician performing sedation Pre-sedation assessment:    Time since last food or drink:  2130   NPO status caution: urgency dictates proceeding with non-ideal NPO status     ASA classification: class 1 - normal, healthy patient     Mouth opening:  3 or more finger widths   Thyromental distance:  4 finger widths   Mallampati score:  I - soft palate, uvula, fauces, pillars visible   Neck mobility: normal     Pre-sedation assessments completed and reviewed: airway patency, cardiovascular function, hydration status, mental status, nausea/vomiting, pain level, respiratory function and temperature   Immediate pre-procedure details:    Reassessment: Patient reassessed immediately prior to procedure     Reviewed: vital signs, relevant labs/tests and NPO status     Verified: bag valve mask available, emergency equipment available, intubation equipment available, IV patency confirmed, oxygen available, reversal medications available and suction available   Procedure details (see MAR for exact dosages):    Preoxygenation:  Nasal cannula   Sedation:  Etomidate   Intended level of sedation: deep   Analgesia:  Fentanyl   Intra-procedure monitoring:  Blood pressure monitoring, continuous capnometry, frequent LOC assessments, frequent vital sign checks, continuous pulse oximetry and cardiac monitor   Intra-procedure events: none     Total Provider sedation time (minutes):  15 Post-procedure details:    Post-sedation assessment completed:  04/10/2021 3:44 AM   Attendance: Constant attendance by certified staff until patient recovered     Recovery: Patient returned to pre-procedure baseline     Post-sedation assessments completed and reviewed: airway patency, cardiovascular function, hydration status, mental status, nausea/vomiting, pain level and respiratory function     Patient is stable for  discharge or admission: yes     Procedure completion:  Tolerated well, no immediate complications Comments:     Inadequate sedation with etomidate; propofol added with good effect   CRITICAL CARE Performed by: Paulette Blanch   Total critical care time: 45 minutes  Critical care time was exclusive of separately billable procedures and treating other patients.  Critical care was necessary to treat or prevent imminent or life-threatening deterioration.  Critical care was time spent personally by me on the following activities: development of treatment plan with patient and/or surrogate as well as nursing, discussions with consultants, evaluation of patient's response to treatment, examination of patient, obtaining history from patient or surrogate, ordering and performing treatments and interventions, ordering and review of laboratory studies, ordering and review of radiographic studies, pulse oximetry and re-evaluation of patient's condition.    ____________________________________________   INITIAL IMPRESSION / ASSESSMENT AND PLAN / ED COURSE  As part of my medical decision making, I reviewed the following data within the [place] notes reviewed and incorporated, Labs reviewed, Old chart reviewed, Radiograph reviewed, and Notes from prior ED visits     40 year old female who presents status post right ankle injury with deformity.  Differential diagnosis  includes but is not limited to fracture, dislocation, fracture-location, musculoskeletal contusion, sprain, etc.  Will obtain plain film images of right ankle and foot.  Keep n.p.o.  Clinical Course as of 04/10/21 0545  Wed Apr 10, 2021  0205 Updated patient and spouse on x-ray imaging results.  Will prepare for deep sedation and right closed ankle reduction. [JS]  0303 Patient tolerated deep sedation well.  Will obtain postreduction films and discuss with orthopedics. [JS]  [phone] Spoke with Dr. Posey Pronto from  orthopedics who recommends podiatry consultation.  Discussed with Dr. Amalia Hailey from podiatry who agrees with admission by hospitalist services, n.p.o. after breakfast. [JS]    Clinical Course User Index [JS] Paulette Blanch, MD     ____________________________________________   FINAL CLINICAL IMPRESSION(S) / ED DIAGNOSES  Final diagnoses:  Closed trimalleolar fracture of right ankle, initial encounter  Dislocation of right ankle joint, initial encounter     ED Discharge Orders     None        Note:  This document was prepared using Dragon voice recognition software and may include unintentional dictation errors.    Paulette Blanch, MD 04/10/21 [phone]

## 2021-04-10 NOTE — Sedation Documentation (Addendum)
Right posterior supported cadillac splint in place with protected layer prior to 4-inch ortho glass placement. Right foot has good circulation as evidenced by pink color, mobility of toes, and strong pedal pulse.

## 2021-04-10 NOTE — Progress Notes (Signed)
PROGRESS NOTE    Jill Garrett  [id number] DOB: 17-Feb-1981 DOA: 04/10/2021 PCP: Patient, No Pcp Per (Inactive)    Brief Narrative:  40 y.o. female with medical history significant for rosacea and hypertension, who presents to the ER with acute onset of accidental mechanical fall while she was getting out of bed and after taking a couple of steps, noted to have R ankle trimalleolar fracture with dislocation  Assessment & Plan:   Active Problems:   Closed right ankle fracture, initial encounter    1.  Closed right ankle trimalleolar fracture with dislocation status post reduction with persistent significant displacement requiring operative intervention secondary to mechanical fall. - Cont with analgesia as needed -Podiatry consulted initially with plans for surgical management while in hospital - on re-evaluation by Podiatry, pt was noted to have significant swelling, thus new recommendations are noted to delay surgery for 7-10 days as ouptatient -PT ordered with nonweightbearing strict RLE, cont LE elevation, ice -F/u on PT recommendations  2.  Essential hypertension, likely diet managed. - Blood pressure is fairly controlled.    DVT prophylaxis: SCD's Code Status: Full Family Communication: Pt in room, family at bedside  Status is: Inpatient  Remains inpatient appropriate because:Inpatient level of care appropriate due to severity of illness  Dispo: The patient is from: Home              Anticipated d/c is to: Home              Patient currently is not medically stable to d/c.   Difficult to place patient No       Consultants:  Podiatry  Procedures:    Antimicrobials: Anti-infectives (From admission, onward)    None       Subjective: Without complaints.   Objective: Vitals:   04/10/21 0630 04/10/21 0700 04/10/21 0800 04/10/21 1229  BP: (!) 102/58 (!) 105/56 118/66 119/64  Pulse: 85 77 79 80  Resp: '14 14 16 15  '$ Temp:  98 F (36.7 C) 98.6 F (37  C) 98.6 F (37 C)  TempSrc:  Oral Oral Oral  SpO2: 97% 94% 100% 98%  Weight:      Height:        Intake/Output Summary (Last 24 hours) at 04/10/2021 1535 Last data filed at 04/10/2021 0351 Gross per 24 hour  Intake 34.5 ml  Output --  Net 34.5 ml   Filed Weights   04/10/21 0107  Weight: 103.4 kg    Examination: General exam: Awake, laying in bed, in nad Respiratory system: Normal respiratory effort, no wheezing Cardiovascular system: regular rate, s1, s2 Gastrointestinal system: Soft, nondistended, positive BS Central nervous system: CN2-12 grossly intact, strength intact Extremities: Perfused, no clubbing, RLE with dressings in place Skin: Normal skin turgor, no notable skin lesions seen Psychiatry: Mood normal // no visual hallucinations   Data Reviewed: I have personally reviewed following labs and imaging studies  CBC: Recent Labs  Lab 04/10/21 0117 04/10/21 0613  WBC 10.9* 14.1*  NEUTROABS 7.3  --   HGB 12.8 12.5  HCT 38.9 37.5  MCV 85.5 85.0  PLT 291 [id number]   Basic Metabolic Panel: Recent Labs  Lab 04/10/21 0117 04/10/21 0613  NA 140 138  K 4.0 4.3  CL 106 107  CO2 27 24  GLUCOSE 117* 122*  BUN 13 11  CREATININE 0.75 0.67  CALCIUM 8.7* 8.5*   GFR: Estimated Creatinine Clearance: 111.2 mL/min (by C-G formula based on SCr of 0.67 mg/dL). Liver Function Tests: Recent  Labs  Lab 04/10/21 0117  AST 18  ALT 20  ALKPHOS 78  BILITOT 0.3  PROT 7.4  ALBUMIN 3.9   No results for input(s): LIPASE, AMYLASE in the last 168 hours. No results for input(s): AMMONIA in the last 168 hours. Coagulation Profile: No results for input(s): INR, PROTIME in the last 168 hours. Cardiac Enzymes: No results for input(s): CKTOTAL, CKMB, CKMBINDEX, TROPONINI in the last 168 hours. BNP (last 3 results) No results for input(s): PROBNP in the last 8760 hours. HbA1C: No results for input(s): HGBA1C in the last 72 hours. CBG: No results for input(s): GLUCAP in the last  168 hours. Lipid Profile: No results for input(s): CHOL, HDL, LDLCALC, TRIG, CHOLHDL, LDLDIRECT in the last 72 hours. Thyroid Function Tests: No results for input(s): TSH, T4TOTAL, FREET4, T3FREE, THYROIDAB in the last 72 hours. Anemia Panel: No results for input(s): VITAMINB12, FOLATE, FERRITIN, TIBC, IRON, RETICCTPCT in the last 72 hours. Sepsis Labs: No results for input(s): PROCALCITON, LATICACIDVEN in the last 168 hours.  Recent Results (from the past 240 hour(s))  Resp Panel by RT-PCR (Flu A&B, Covid) Nasopharyngeal Swab     Status: None   Collection Time: 04/10/21  3:57 AM   Specimen: Nasopharyngeal Swab; Nasopharyngeal(NP) swabs in vial transport medium  Result Value Ref Range Status   SARS Coronavirus 2 by RT PCR NEGATIVE NEGATIVE Final    Comment: (NOTE) SARS-CoV-2 target nucleic acids are NOT DETECTED.  The SARS-CoV-2 RNA is generally detectable in upper respiratory specimens during the acute phase of infection. The lowest concentration of SARS-CoV-2 viral copies this assay can detect is 138 copies/mL. A negative result does not preclude SARS-Cov-2 infection and should not be used as the sole basis for treatment or other patient management decisions. A negative result may occur with  improper specimen collection/handling, submission of specimen other than nasopharyngeal swab, presence of viral mutation(s) within the areas targeted by this assay, and inadequate number of viral copies(<138 copies/mL). A negative result must be combined with clinical observations, patient history, and epidemiological information. The expected result is Negative.  Fact Sheet for Patients:  EntrepreneurPulse.com.au  Fact Sheet for Healthcare Providers:  IncredibleEmployment.be  This test is no t yet approved or cleared by the [place] FDA and  has been authorized for detection and/or diagnosis of SARS-CoV-2 by FDA under an Emergency Use  Authorization (EUA). This EUA will remain  in effect (meaning this test can be used) for the duration of the COVID-19 declaration under Section 564(b)(1) of the Act, 21 U.S.C.section 360bbb-3(b)(1), unless the authorization is terminated  or revoked sooner.       Influenza A by PCR NEGATIVE NEGATIVE Final   Influenza B by PCR NEGATIVE NEGATIVE Final    Comment: (NOTE) The Xpert Xpress SARS-CoV-2/FLU/RSV plus assay is intended as an aid in the diagnosis of influenza from Nasopharyngeal swab specimens and should not be used as a sole basis for treatment. Nasal washings and aspirates are unacceptable for Xpert Xpress SARS-CoV-2/FLU/RSV testing.  Fact Sheet for Patients: EntrepreneurPulse.com.au  Fact Sheet for Healthcare Providers: IncredibleEmployment.be  This test is not yet approved or cleared by the [place] FDA and has been authorized for detection and/or diagnosis of SARS-CoV-2 by FDA under an Emergency Use Authorization (EUA). This EUA will remain in effect (meaning this test can be used) for the duration of the COVID-19 declaration under Section 564(b)(1) of the Act, 21 U.S.C. section 360bbb-3(b)(1), unless the authorization is terminated or revoked.  Performed at [facility],  [place], [place] [address]   Surgical PCR screen     Status: None   Collection Time: 04/10/21 11:09 AM   Specimen: Nasal Mucosa; Nasal Swab  Result Value Ref Range Status   MRSA, PCR NEGATIVE NEGATIVE Final   Staphylococcus aureus NEGATIVE NEGATIVE Final    Comment: (NOTE) The Xpert SA Assay (FDA approved for NASAL specimens in patients 74 years of age and older), is one component of a comprehensive surveillance program. It is not intended to diagnose infection nor to guide or monitor treatment. Performed at [facility], [address], [place], [place] [address]      Radiology Studies: DG Ankle 2 Views  Right  Result Date: 04/10/2021 CLINICAL DATA:  Postreduction EXAM: RIGHT ANKLE - 2 VIEW COMPARISON:  04/10/2021 FINDINGS: Interval reduction of the previously seen dislocation of the right ankle. Continued significant displacement across the comminuted distal fibular metadiaphyseal fractures, posterior malleolar fracture and medial malleolar fractures. IMPRESSION: Interval reduction of the dislocated right ankle. Trimalleolar fracture with continued significant displacement. Electronically Signed   By: Rolm Baptise M.D.   On: 04/10/2021 03:24   DG Ankle Complete Right  Result Date: 04/10/2021 CLINICAL DATA:  Fall, right ankle injury EXAM: RIGHT ANKLE - COMPLETE 3+ VIEW COMPARISON:  None. FINDINGS: Three view radiograph right ankle demonstrates a a comminuted trimalleolar fracture dislocation of the right ankle. A comminuted, segmental fracture of the distal right fibula is seen with a free-floating segment comprised of the distal diaphysis with 1/2 shaft with posterior displacement, override and moderate posterolateral angulation of the distal fracture fragment. A a posterior malleolar fracturel fragment is identified demonstrating roughly 7 mm superior displacement and 1.5 cm lateral displacement of the fracture fragment which comprises roughly 25% of the articular surface. There is a avulsion type medial malleolar fracture at the level of the tibial plafond with the medial malleolar fracture fragment demonstrating lateral displacement by approximately 1.5 cm and mild lateral angulation. There is posterolateral dislocation of the talar dome in relation to the tibial plafond. IMPRESSION: Trimalleolar fracture dislocation of the right ankle as described above. Electronically Signed   By: Fidela Salisbury M.D.   On: 04/10/2021 01:51   CT ANKLE RIGHT WO CONTRAST  Result Date: 04/10/2021 CLINICAL DATA:  Right ankle fracture post reduction. EXAM: CT OF THE RIGHT ANKLE WITHOUT CONTRAST TECHNIQUE: Multidetector CT  imaging of the right ankle was performed according to the standard protocol. Multiplanar CT image reconstructions were also generated. COMPARISON:  Radiographs same date. FINDINGS: Bones/Joint/Cartilage The ankle has been splinted. There is improved alignment of the trimalleolar fracture compared with the original radiographs. The tibiotalar dislocation has been reduced. Segmental fracture of the distal fibular diaphysis remains mildly displaced posteriorly both proximally and distally. The segmental fracture fragment measures up to 11 cm in length. This fracture extends into the anterior aspect of the lateral malleolus where there is comminution. There is a mildly displaced predominantly transverse fracture through the base of the medial malleolus. Fracture of the tibial plafond posteriorly is associated with 5 mm of depression of the articular surface. The tarsal bones appear intact. Ligaments Suboptimally assessed by CT. Muscles and Tendons No evidence of tendon rupture. The peroneal tendons abut the fractures of the distal fibula, but appear intact without entrapment. The posterior tibialis tendon is mildly displaced laterally into the fracture of the posterior malleolus, but appears intact. Soft tissues Moderate soft tissue swelling around the ankle. No focal fluid collection, foreign body or soft tissue emphysema.  IMPRESSION: 1. Stable alignment of the trimalleolar fracture status post closed reduction. There is a segmental fracture of the distal fibular diaphysis which remains posteriorly displaced, and there is depression of the articular surface of the tibial plafond posteriorly. 2. Reduced tibiotalar dislocation. No evidence of tarsal bone fracture. 3. The posterior tibialis tendon is laterally displaced into the fracture of the posterior malleolus which could lead to tendon entrapment. No evidence of tendon rupture. Electronically Signed   By: Richardean Sale M.D.   On: 04/10/2021 08:37   DG Foot 2  Views Right  Result Date: 04/10/2021 CLINICAL DATA:  Fall, right ankle injury EXAM: RIGHT FOOT - 2 VIEW COMPARISON:  None. FINDINGS: Trimalleolar right ankle fracture dislocation is better assessed on accompanying radiographs of the right ankle. Normal alignment of the bones of the right foot. No fracture or dislocation. Joint spaces are preserved. Soft tissues are unremarkable IMPRESSION: No fracture or dislocation involving the bones of the right foot. Electronically Signed   By: Fidela Salisbury M.D.   On: 04/10/2021 01:52    Scheduled Meds: Continuous Infusions:  sodium chloride 100 mL/hr at 04/10/21 0515     LOS: 0 days   Marylu Lund, MD [facility] Pager On Amion  If 7PM-7AM, please contact night-coverage 04/10/2021, 3:35 PM

## 2021-04-10 NOTE — ED Triage Notes (Signed)
Patient arrived via EMS after a right ankle injury. States she got out of bed and turned her ankle. Deformity noted and ankle splinted by EMS. Pt was given 148mg of Fentanyl in route.

## 2021-04-10 NOTE — H&P (Signed)
[place]   PATIENT NAME: Jill Garrett    MR#:  [id number]  DATE OF BIRTH:  02-02-1981  DATE OF ADMISSION:  04/10/2021  PRIMARY CARE PHYSICIAN: Patient, No Pcp Per (Inactive)   Patient is coming from: Home  REQUESTING/REFERRING PHYSICIAN: Lurline Hare, MD  CHIEF COMPLAINT:   Chief Complaint  Patient presents with   right ankle injury    HISTORY OF PRESENT ILLNESS:  Jill Garrett is a 41 y.o. female with medical history significant for rosacea and hypertension, who presents to the ER with acute onset of accidental mechanical fall while she was getting out of bed and after taking a couple of steps.  She stated that she lost her balance and fell on her buttock and her right ankle.  She denied any presyncope or syncope.  No chest pain or dyspnea or palpitations.  No paresthesias or focal muscle weakness.  No recent fever or chills.  No cough or wheezing.  No dysuria, oliguria or hematuria or flank pain. No bleeding diathesis.  ED Course: Upon presentation to the emergency room vital signs were within normal.  Labs revealed WBC of 10.9 with otherwise unremarkable CBC and CMP.  Influenza antigens and COVID-19 PCR came back negative.  Alcohol level was 21.  Imaging: Right ankle x-ray showed trimalleolar fracture and dislocation of the right ankle.  After reduction repeat x-ray showed the reduction with continued significant displacement.  And right foot x-ray showed no fracture or dislocation.  The patient was given 15 mcg of IV fentanyl and 10 mg of IV etomidate for reduction in the rate IV Dilaudid and Zofran.  Contact was made with on-call orthopedist who recommended podiatry.  Dr. Amalia Hailey with 3 podiatry recommended n.p.o. status after breakfast.  She will be admitted to a medical bed for further evaluation and management. PAST MEDICAL HISTORY:   Past Medical History:  Diagnosis Date   Rosacea   Hypertension  PAST SURGICAL HISTORY:   Past Surgical History:  Procedure  Laterality Date   CESAREAN SECTION      SOCIAL HISTORY:   Social History   Tobacco Use   Smoking status: Never   Smokeless tobacco: Never  Substance Use Topics   Alcohol use: Yes    Comment: Two mixed drinks tonight    FAMILY HISTORY:   Positive for diabetes mellitus in her father. DRUG ALLERGIES:  No Known Allergies  REVIEW OF SYSTEMS:   ROS As per history of present illness. All pertinent systems were reviewed above. Constitutional, HEENT, cardiovascular, respiratory, GI, GU, musculoskeletal, neuro, psychiatric, endocrine, integumentary and hematologic systems were reviewed and are otherwise negative/unremarkable except for positive findings mentioned above in the HPI.   MEDICATIONS AT HOME:   Prior to Admission medications   Medication Sig Start Date End Date Taking? Authorizing Provider  doxycycline (ORACEA) 40 MG capsule Take 1 capsule (40 mg total) by mouth every morning. 12/24/20   Brendolyn Patty, MD      VITAL SIGNS:  Blood pressure 118/63, pulse 87, temperature 98.1 F (36.7 C), temperature source Oral, resp. rate 13, height '5\' 6"'$  (1.676 m), weight 103.4 kg, last menstrual period 04/04/2021, SpO2 100 %.  PHYSICAL EXAMINATION:  Physical Exam  GENERAL:  40 y.o.-year-old patient lying in the bed with no acute distress.  EYES: Pupils equal, round, reactive to light and accommodation. No scleral icterus. Extraocular muscles intact.  HEENT: Head atraumatic, normocephalic. Oropharynx and nasopharynx clear.  NECK:  Supple, no jugular venous distention. No thyroid enlargement, no  tenderness.  LUNGS: Normal breath sounds bilaterally, no wheezing, rales,rhonchi or crepitation. No use of accessory muscles of respiration.  CARDIOVASCULAR: Regular rate and rhythm, S1, S2 normal. No murmurs, rubs, or gallops.  ABDOMEN: Soft, nondistended, nontender. Bowel sounds present. No organomegaly or mass.  EXTREMITIES: No pedal edema, cyanosis, or clubbing.  NEUROLOGIC: Cranial nerves  II through XII are intact. Muscle strength 5/5 in all extremities. Sensation intact. Gait not checked. Musculoskeletal: Right ankle in splint. PSYCHIATRIC: The patient is alert and oriented x 3.  Normal affect and good eye contact. SKIN: No obvious rash, lesion, or ulcer.   LABORATORY PANEL:   CBC Recent Labs  Lab 04/10/21 0117  WBC 10.9*  HGB 12.8  HCT 38.9  PLT 291   ------------------------------------------------------------------------------------------------------------------  Chemistries  Recent Labs  Lab 04/10/21 0117  NA 140  K 4.0  CL 106  CO2 27  GLUCOSE 117*  BUN 13  CREATININE 0.75  CALCIUM 8.7*  AST 18  ALT 20  ALKPHOS 78  BILITOT 0.3   ------------------------------------------------------------------------------------------------------------------  Cardiac Enzymes No results for input(s): TROPONINI in the last 168 hours. ------------------------------------------------------------------------------------------------------------------  RADIOLOGY:  DG Ankle 2 Views Right  Result Date: 04/10/2021 CLINICAL DATA:  Postreduction EXAM: RIGHT ANKLE - 2 VIEW COMPARISON:  04/10/2021 FINDINGS: Interval reduction of the previously seen dislocation of the right ankle. Continued significant displacement across the comminuted distal fibular metadiaphyseal fractures, posterior malleolar fracture and medial malleolar fractures. IMPRESSION: Interval reduction of the dislocated right ankle. Trimalleolar fracture with continued significant displacement. Electronically Signed   By: Rolm Baptise M.D.   On: 04/10/2021 03:24   DG Ankle Complete Right  Result Date: 04/10/2021 CLINICAL DATA:  Fall, right ankle injury EXAM: RIGHT ANKLE - COMPLETE 3+ VIEW COMPARISON:  None. FINDINGS: Three view radiograph right ankle demonstrates a a comminuted trimalleolar fracture dislocation of the right ankle. A comminuted, segmental fracture of the distal right fibula is seen with a  free-floating segment comprised of the distal diaphysis with 1/2 shaft with posterior displacement, override and moderate posterolateral angulation of the distal fracture fragment. A a posterior malleolar fracturel fragment is identified demonstrating roughly 7 mm superior displacement and 1.5 cm lateral displacement of the fracture fragment which comprises roughly 25% of the articular surface. There is a avulsion type medial malleolar fracture at the level of the tibial plafond with the medial malleolar fracture fragment demonstrating lateral displacement by approximately 1.5 cm and mild lateral angulation. There is posterolateral dislocation of the talar dome in relation to the tibial plafond. IMPRESSION: Trimalleolar fracture dislocation of the right ankle as described above. Electronically Signed   By: Fidela Salisbury M.D.   On: 04/10/2021 01:51   DG Foot 2 Views Right  Result Date: 04/10/2021 CLINICAL DATA:  Fall, right ankle injury EXAM: RIGHT FOOT - 2 VIEW COMPARISON:  None. FINDINGS: Trimalleolar right ankle fracture dislocation is better assessed on accompanying radiographs of the right ankle. Normal alignment of the bones of the right foot. No fracture or dislocation. Joint spaces are preserved. Soft tissues are unremarkable IMPRESSION: No fracture or dislocation involving the bones of the right foot. Electronically Signed   By: Fidela Salisbury M.D.   On: 04/10/2021 01:52      IMPRESSION AND PLAN:  Active Problems:   Closed right ankle fracture, initial encounter  1.  Closed right ankle trimalleolar fracture with dislocation status post reduction with persistent significant displacement requiring operative intervention secondary to mechanical fall. - The patient will be admitted to a medical bed. -  Pain management will be provided. - Should be kept n.p.o. after breakfast per podiatry recommendation. - Podiatry consultation will be obtained. - Dr. Amalia Hailey was notified about the patient. - The  patient has no history of diabetes mellitus on insulin, CVA, coronary artery disease, renal failure or CHF.  She is considered low risk per the revised cardiac risk index for perioperative cardiovascular events.  She has no current pulmonary issues.  2.  Essential hypertension, likely diet managed. - Blood pressure is fairly controlled.   DVT prophylaxis: SCDs. Code Status: full code. Family Communication:  The plan of care was discussed in details with the patient (and her husband who was with her in the room). I answered all questions. The patient agreed to proceed with the above mentioned plan. Further management will depend upon hospital course. Disposition Plan: Back to previous home environment Consults called: Podiatry. All the records are reviewed and case discussed with ED provider.  Status is: Inpatient  Remains inpatient appropriate because:Ongoing active pain requiring inpatient pain management, Ongoing diagnostic testing needed not appropriate for outpatient work up, Unsafe d/c plan, IV treatments appropriate due to intensity of illness or inability to take PO, and Inpatient level of care appropriate due to severity of illness  Dispo: The patient is from: Home              Anticipated d/c is to: Home              Patient currently is not medically stable to d/c.   Difficult to place patient No  TOTAL TIME TAKING CARE OF THIS PATIENT: 50 minutes.    Christel Mormon M.D on 04/10/2021 at 4:57 AM  [facility]   From 7 PM-7 AM, contact night-coverage [email or web address]  CC: Primary care physician; Patient, No Pcp Per (Inactive)

## 2021-04-10 NOTE — ED Notes (Signed)
Lab at the bedside 

## 2021-04-10 NOTE — ED Notes (Addendum)
Husband back at the bedside

## 2021-04-10 NOTE — ED Notes (Signed)
Dr. Mansy at the bedside. 

## 2021-04-10 NOTE — Evaluation (Addendum)
Physical Therapy Evaluation Patient Details Name: Jill Garrett MRN: [id number] DOB: [birth year] Today's Date: 04/10/2021   History of Present Illness  Pt is a 40 y.o. female with medical history significant hypertension presenting to ED following a mechanical fall leading to R trimalleolar fx & dislocation of R ankle.  Clinical Impression  Pt alert in bed with spouse present throughout treatment. Pt states PLOF as independent with ADLs and ambulation. Following discharge, pt plans on staying with parents due to one-level home. Pt notes increased pain with movement, 2-3/10 at rest, but agreeable to treatment.  Pt requires min-A for bed mobility, HOB elevated for RLE. Transferred w/ RW, min-guard without LOB. Pt able to hop while maintaining NWB precautions w/ min-guard RW, and hop 2 stairs backwards w/ RW, min-A to mimic home environment. Skilled PT intervention is indicated to address deficits in function, mobility, and to return to PLOF as able.  HHPT is recommended due to change from PLOF.   Follow Up Recommendations Follow surgeon's recommendations for follow up therapies    Equipment Recommendations  Rolling walker with 5" wheels;3in1 (PT)    Recommendations for Other Services       Precautions / Restrictions Precautions Precautions: Fall Required Braces or Orthoses: Splint/Cast Splint/Cast: RLE plint Restrictions Weight Bearing Restrictions: Yes RLE Weight Bearing: Non weight bearing      Mobility  Bed Mobility Overal bed mobility: Needs Assistance Bed Mobility: Supine to Sit     Supine to sit: HOB elevated;Min assist     General bed mobility comments: Min-A for RLE mobilization    Transfers Overall transfer level: Needs assistance Equipment used: Rolling walker (2 wheeled) Transfers: Sit to/from Stand Sit to Stand: Min guard         General transfer comment: Min-gaurd for safety and cues for hand, LE placement during  transfer  Ambulation/Gait Ambulation/Gait assistance: Min guard Gait Distance (Feet): 35 Feet Assistive device: Rolling walker (2 wheeled)       General Gait Details: Pt able to hop on LLE w/RE while maintaining NWB status  Stairs Stairs: Yes Stairs assistance: Min assist Stair Management: No rails;With walker Number of Stairs: 2 General stair comments: Min-A for RW stabilization, Pt able to hop 2 steps with RW w/o LOB or instability  Wheelchair Mobility    Modified Rankin (Stroke Patients Only)       Balance Overall balance assessment: Needs assistance Sitting-balance support: Feet unsupported;No upper extremity supported Sitting balance-Leahy Scale: Good     Standing balance support: Bilateral upper extremity supported;During functional activity Standing balance-Leahy Scale: Fair Standing balance comment: Pt requires BUE to maintain NWB status                             Pertinent Vitals/Pain Pain Assessment: 0-10 Pain Score: 3  Pain Location: R ankle Pain Descriptors / Indicators: Aching;Grimacing Pain Intervention(s): Limited activity within patient's tolerance;Monitored during session;Repositioned    Home Living Family/patient expects to be discharged to:: Private residence Living Arrangements: Other relatives (parents) Available Help at Discharge: Family Type of Home: House Home Access: Stairs to enter Entrance Stairs-Rails: None Entrance Stairs-Number of Steps: 2 Home Layout: One level        Prior Function Level of Independence: Independent         Comments: Independent with ADLs, ambulation w/o AD     Hand Dominance        Extremity/Trunk Assessment   Upper Extremity Assessment Upper Extremity Assessment: Overall [facility]  for tasks assessed    Lower Extremity Assessment Lower Extremity Assessment: RLE deficits/detail RLE Deficits / Details: SILT to toes RLE: Unable to fully assess due to immobilization        Communication   Communication: No difficulties  Cognition Arousal/Alertness: Awake/alert Behavior During Therapy: WFL for tasks assessed/performed Overall Cognitive Status: Within Functional Limits for tasks assessed                                 General Comments: AOx4      General Comments      Exercises Other Exercises Other Exercises: Hopping x 5 w/ RW, min-gaurd   Assessment/Plan    PT Assessment Patient needs continued PT services  PT Problem List Decreased range of motion;Decreased strength;Decreased activity tolerance;Decreased balance;Decreased coordination       PT Treatment Interventions Balance training;Gait training;Stair training;Functional mobility training;Therapeutic activities;Therapeutic exercise;Neuromuscular re-education    PT Goals (Current goals can be found in the Care Plan section)  Acute Rehab PT Goals Patient Stated Goal: To get surgery PT Goal Formulation: With patient Time For Goal Achievement: 04/24/21 Potential to Achieve Goals: Good    Frequency 7X/week   Barriers to discharge        Co-evaluation               AM-PAC PT "6 Clicks" Mobility  Outcome Measure Help needed turning from your back to your side while in a flat bed without using bedrails?: A Little Help needed moving from lying on your back to sitting on the side of a flat bed without using bedrails?: A Little Help needed moving to and from a bed to a chair (including a wheelchair)?: A Little Help needed standing up from a chair using your arms (e.g., wheelchair or bedside chair)?: A Little Help needed to walk in hospital room?: A Little Help needed climbing 3-5 steps with a railing? : A Little 6 Click Score: 18    End of Session Equipment Utilized During Treatment: Gait belt Activity Tolerance: Patient tolerated treatment well Patient left: in bed;with call bell/phone within reach;with bed alarm set Nurse Communication: Mobility status PT Visit  Diagnosis: Other abnormalities of gait and mobility (R26.89);Muscle weakness (generalized) (M62.81)    Time: [id number] PT Time Calculation (min) (ACUTE ONLY): 57 min   Charges:             [employer], SPT

## 2021-04-10 NOTE — Progress Notes (Signed)
Patient does not want to be restarted on fluids, she eating and drinking well. This nurse notified Dr. Sherryle Lis about stopping fluids. He states that it should be fine and will most likely be discharged tomorrow on a regular diet.

## 2021-04-10 NOTE — ED Notes (Signed)
E-Sign not working, paper copy printed and signed by spouse per request by pt. Engineer, maintenance (IT) witnessed.

## 2021-04-10 NOTE — Consult Note (Signed)
Reason for Consult: Right ankle fracture  Referring Physician: Eugenie Norrie MD  Jill Garrett is an 40 y.o. female.  HPI: Patient is seen at bedside resting comfortably with her husband in the room as well.  She states that she got up and got out of bed to use the restroom last night and her foot caught up under her and she just fell.  Was surprised that it hurts so bad and the bone was nearly sticking through the skin.  She was brought to the emergency room and x-rays showed trimal ankle fracture she was reduced under sedation by the ER staff and put in a posterior and saddle splint.  Past Medical History:  Diagnosis Date   Rosacea     Past Surgical History:  Procedure Laterality Date   CESAREAN SECTION      History reviewed. No pertinent family history.  Social History:  reports that she has never smoked. She has never used smokeless tobacco. She reports current alcohol use. She reports that she does not use drugs.  Allergies: No Known Allergies  Medications: I have reviewed the patient's current medications.  Results for orders placed or performed during the hospital encounter of 04/10/21 (from the past 48 hour(s))  CBC with Differential     Status: Abnormal   Collection Time: 04/10/21  1:17 AM  Result Value Ref Range   WBC 10.9 (H) 4.0 - 10.5 K/uL   RBC 4.55 3.87 - 5.11 MIL/uL   Hemoglobin 12.8 12.0 - 15.0 g/dL   HCT 38.9 36.0 - 46.0 %   MCV 85.5 80.0 - 100.0 fL   MCH 28.1 26.0 - 34.0 pg   MCHC 32.9 30.0 - 36.0 g/dL   RDW 13.6 11.5 - 15.5 %   Platelets 291 150 - 400 K/uL   nRBC 0.0 0.0 - 0.2 %   Neutrophils Relative % 67 %   Neutro Abs 7.3 1.7 - 7.7 K/uL   Lymphocytes Relative 23 %   Lymphs Abs 2.4 0.7 - 4.0 K/uL   Monocytes Relative 7 %   Monocytes Absolute 0.8 0.1 - 1.0 K/uL   Eosinophils Relative 3 %   Eosinophils Absolute 0.3 0.0 - 0.5 K/uL   Basophils Relative 0 %   Basophils Absolute 0.0 0.0 - 0.1 K/uL   Immature Granulocytes 0 %   Abs Immature  Granulocytes 0.03 0.00 - 0.07 K/uL    Comment: Performed at [facility], [address] [place], [place] [address]  Ethanol     Status: Abnormal   Collection Time: 04/10/21  1:17 AM  Result Value Ref Range   Alcohol, Ethyl (B) 21 (H) <10 mg/dL    Comment: (NOTE) Lowest detectable limit for serum alcohol is 10 mg/dL.  For medical purposes only. Performed at [facility], [address] [place], [place] [address]   Comprehensive metabolic panel     Status: Abnormal   Collection Time: 04/10/21  1:17 AM  Result Value Ref Range   Sodium 140 135 - 145 mmol/L   Potassium 4.0 3.5 - 5.1 mmol/L   Chloride 106 98 - 111 mmol/L   CO2 27 22 - 32 mmol/L   Glucose, Bld 117 (H) 70 - 99 mg/dL    Comment: Glucose reference range applies only to samples taken after fasting for at least 8 hours.   BUN 13 6 - 20 mg/dL   Creatinine, Ser 0.75 0.44 - 1.00 mg/dL   Calcium 8.7 (L) 8.9 - 10.3 mg/dL   Total  Protein 7.4 6.5 - 8.1 g/dL   Albumin 3.9 3.5 - 5.0 g/dL   AST 18 15 - 41 U/L   ALT 20 0 - 44 U/L   Alkaline Phosphatase 78 38 - 126 U/L   Total Bilirubin 0.3 0.3 - 1.2 mg/dL   GFR, Estimated >60 >60 mL/min    Comment: (NOTE) Calculated using the CKD-EPI Creatinine Equation (2021)    Anion gap 7 5 - 15    Comment: Performed at [facility], [address], [place], [place] [address]  VITAMIN D 25 Hydroxy (Vit-D Deficiency, Fractures)     Status: Abnormal   Collection Time: 04/10/21  1:17 AM  Result Value Ref Range   Vit D, 25-Hydroxy 18.51 (L) 30 - 100 ng/mL    Comment: (NOTE) Vitamin D deficiency has been defined by the [place] practice guideline as a level of serum 25-OH  vitamin D less than 20 ng/mL (1,2). The Endocrine Society went on to  further define vitamin D insufficiency as a level between 21 and 29  ng/mL (2).  1. IOM (Institute of Medicine). 2010. Dietary reference intakes for  calcium and D. [place]: The  [employer]. 2. Holick MF, Binkley [place], Bischoff-Ferrari HA, et al. Evaluation,  treatment, and prevention of vitamin D deficiency: an Endocrine  Society clinical practice guideline, JCEM. 2011 Jul; 96(7): 1911-30.  Performed at [facility], [place] [address] [place], [place] [address]   Resp Panel by RT-PCR (Flu A&B, Covid) Nasopharyngeal Swab     Status: None   Collection Time: 04/10/21  3:57 AM   Specimen: Nasopharyngeal Swab; Nasopharyngeal(NP) swabs in vial transport medium  Result Value Ref Range   SARS Coronavirus 2 by RT PCR NEGATIVE NEGATIVE    Comment: (NOTE) SARS-CoV-2 target nucleic acids are NOT DETECTED.  The SARS-CoV-2 RNA is generally detectable in upper respiratory specimens during the acute phase of infection. The lowest concentration of SARS-CoV-2 viral copies this assay can detect is 138 copies/mL. A negative result does not preclude SARS-Cov-2 infection and should not be used as the sole basis for treatment or other patient management decisions. A negative result may occur with  improper specimen collection/handling, submission of specimen other than nasopharyngeal swab, presence of viral mutation(s) within the areas targeted by this assay, and inadequate number of viral copies(<138 copies/mL). A negative result must be combined with clinical observations, patient history, and epidemiological information. The expected result is Negative.  Fact Sheet for Patients:  EntrepreneurPulse.com.au  Fact Sheet for Healthcare Providers:  IncredibleEmployment.be  This test is no t yet approved or cleared by the [place] FDA and  has been authorized for detection and/or diagnosis of SARS-CoV-2 by FDA under an Emergency Use Authorization (EUA). This EUA will remain  in effect (meaning this test can be used) for the duration of the COVID-19 declaration under Section 564(b)(1) of the Act, 21 U.S.C.section  360bbb-3(b)(1), unless the authorization is terminated  or revoked sooner.       Influenza A by PCR NEGATIVE NEGATIVE   Influenza B by PCR NEGATIVE NEGATIVE    Comment: (NOTE) The Xpert Xpress SARS-CoV-2/FLU/RSV plus assay is intended as an aid in the diagnosis of influenza from Nasopharyngeal swab specimens and should not be used as a sole basis for treatment. Nasal washings and aspirates are unacceptable for Xpert Xpress SARS-CoV-2/FLU/RSV testing.  Fact Sheet for Patients: EntrepreneurPulse.com.au  Fact Sheet for Healthcare Providers: IncredibleEmployment.be  This test is not  yet approved or cleared by the [place] and has been authorized for detection and/or diagnosis of SARS-CoV-2 by FDA under an Emergency Use Authorization (EUA). This EUA will remain in effect (meaning this test can be used) for the duration of the COVID-19 declaration under Section 564(b)(1) of the Act, 21 U.S.C. section 360bbb-3(b)(1), unless the authorization is terminated or revoked.  Performed at [facility], [address] [place], [place] [address]   HIV Antibody (routine testing w rflx)     Status: None   Collection Time: 04/10/21  6:13 AM  Result Value Ref Range   HIV Screen 4th Generation wRfx Non Reactive Non Reactive    Comment: Performed at [facility], [place] [address], [place], [place] [id number]  Basic metabolic panel     Status: Abnormal   Collection Time: 04/10/21  6:13 AM  Result Value Ref Range   Sodium 138 135 - 145 mmol/L   Potassium 4.3 3.5 - 5.1 mmol/L   Chloride 107 98 - 111 mmol/L   CO2 24 22 - 32 mmol/L   Glucose, Bld 122 (H) 70 - 99 mg/dL    Comment: Glucose reference range applies only to samples taken after fasting for at least 8 hours.   BUN 11 6 - 20 mg/dL   Creatinine, Ser 0.67 0.44 - 1.00 mg/dL   Calcium 8.5 (L) 8.9 - 10.3 mg/dL   GFR, Estimated >60 >60 mL/min    Comment: (NOTE) Calculated using the  CKD-EPI Creatinine Equation (2021)    Anion gap 7 5 - 15    Comment: Performed at [facility], [address] [place], [place] [address]  CBC     Status: Abnormal   Collection Time: 04/10/21  6:13 AM  Result Value Ref Range   WBC 14.1 (H) 4.0 - 10.5 K/uL   RBC 4.41 3.87 - 5.11 MIL/uL   Hemoglobin 12.5 12.0 - 15.0 g/dL   HCT 37.5 36.0 - 46.0 %   MCV 85.0 80.0 - 100.0 fL   MCH 28.3 26.0 - 34.0 pg   MCHC 33.3 30.0 - 36.0 g/dL   RDW 13.8 11.5 - 15.5 %   Platelets 306 150 - 400 K/uL   nRBC 0.0 0.0 - 0.2 %    Comment: Performed at [facility], [address], [place], [place] [address]  Surgical PCR screen     Status: None   Collection Time: 04/10/21 11:09 AM   Specimen: Nasal Mucosa; Nasal Swab  Result Value Ref Range   MRSA, PCR NEGATIVE NEGATIVE   Staphylococcus aureus NEGATIVE NEGATIVE    Comment: (NOTE) The Xpert SA Assay (FDA approved for NASAL specimens in patients 46 years of age and older), is one component of a comprehensive surveillance program. It is not intended to diagnose infection nor to guide or monitor treatment. Performed at [facility], [address] [place], [place] [address]     DG Ankle 2 Views Right  Result Date: 04/10/2021 CLINICAL DATA:  Postreduction EXAM: RIGHT ANKLE - 2 VIEW COMPARISON:  04/10/2021 FINDINGS: Interval reduction of the previously seen dislocation of the right ankle. Continued significant displacement across the comminuted distal fibular metadiaphyseal fractures, posterior malleolar fracture and medial malleolar fractures. IMPRESSION: Interval reduction of the dislocated right ankle. Trimalleolar fracture with continued significant displacement. Electronically Signed   By: Rolm Baptise M.D.   On: 04/10/2021 03:24   DG Ankle Complete Right  Result Date: 04/10/2021 CLINICAL DATA:  Fall, right ankle injury  EXAM: RIGHT ANKLE - COMPLETE 3+ VIEW COMPARISON:  None. FINDINGS: Three view radiograph right ankle  demonstrates a a comminuted trimalleolar fracture dislocation of the right ankle. A comminuted, segmental fracture of the distal right fibula is seen with a free-floating segment comprised of the distal diaphysis with 1/2 shaft with posterior displacement, override and moderate posterolateral angulation of the distal fracture fragment. A a posterior malleolar fracturel fragment is identified demonstrating roughly 7 mm superior displacement and 1.5 cm lateral displacement of the fracture fragment which comprises roughly 25% of the articular surface. There is a avulsion type medial malleolar fracture at the level of the tibial plafond with the medial malleolar fracture fragment demonstrating lateral displacement by approximately 1.5 cm and mild lateral angulation. There is posterolateral dislocation of the talar dome in relation to the tibial plafond. IMPRESSION: Trimalleolar fracture dislocation of the right ankle as described above. Electronically Signed   By: Fidela Salisbury M.D.   On: 04/10/2021 01:51   CT ANKLE RIGHT WO CONTRAST  Result Date: 04/10/2021 CLINICAL DATA:  Right ankle fracture post reduction. EXAM: CT OF THE RIGHT ANKLE WITHOUT CONTRAST TECHNIQUE: Multidetector CT imaging of the right ankle was performed according to the standard protocol. Multiplanar CT image reconstructions were also generated. COMPARISON:  Radiographs same date. FINDINGS: Bones/Joint/Cartilage The ankle has been splinted. There is improved alignment of the trimalleolar fracture compared with the original radiographs. The tibiotalar dislocation has been reduced. Segmental fracture of the distal fibular diaphysis remains mildly displaced posteriorly both proximally and distally. The segmental fracture fragment measures up to 11 cm in length. This fracture extends into the anterior aspect of the lateral malleolus where there is comminution. There is a mildly displaced predominantly transverse fracture through the base of the medial  malleolus. Fracture of the tibial plafond posteriorly is associated with 5 mm of depression of the articular surface. The tarsal bones appear intact. Ligaments Suboptimally assessed by CT. Muscles and Tendons No evidence of tendon rupture. The peroneal tendons abut the fractures of the distal fibula, but appear intact without entrapment. The posterior tibialis tendon is mildly displaced laterally into the fracture of the posterior malleolus, but appears intact. Soft tissues Moderate soft tissue swelling around the ankle. No focal fluid collection, foreign body or soft tissue emphysema. IMPRESSION: 1. Stable alignment of the trimalleolar fracture status post closed reduction. There is a segmental fracture of the distal fibular diaphysis which remains posteriorly displaced, and there is depression of the articular surface of the tibial plafond posteriorly. 2. Reduced tibiotalar dislocation. No evidence of tarsal bone fracture. 3. The posterior tibialis tendon is laterally displaced into the fracture of the posterior malleolus which could lead to tendon entrapment. No evidence of tendon rupture. Electronically Signed   By: Richardean Sale M.D.   On: 04/10/2021 08:37   DG Foot 2 Views Right  Result Date: 04/10/2021 CLINICAL DATA:  Fall, right ankle injury EXAM: RIGHT FOOT - 2 VIEW COMPARISON:  None. FINDINGS: Trimalleolar right ankle fracture dislocation is better assessed on accompanying radiographs of the right ankle. Normal alignment of the bones of the right foot. No fracture or dislocation. Joint spaces are preserved. Soft tissues are unremarkable IMPRESSION: No fracture or dislocation involving the bones of the right foot. Electronically Signed   By: Fidela Salisbury M.D.   On: 04/10/2021 01:52    Review of Systems  Musculoskeletal:  Positive for falls and joint pain.  All other systems reviewed and are negative. Blood pressure 119/64, pulse 80, temperature 98.6 F (  37 C), temperature source Oral, resp.  rate 15, height '5\' 6"'$  (1.676 m), weight 103.4 kg, last menstrual period 04/04/2021, SpO2 98 %.  Vitals:   04/10/21 0800 04/10/21 1229  BP: 118/66 119/64  Pulse: 79 80  Resp: 16 15  Temp: 98.6 F (37 C) 98.6 F (37 C)  SpO2: 100% 98%    General AA&O x3. Normal mood and affect.  Vascular Dorsalis pedis and posterior tibial pulses unable to assess secondary to splint Capillary refill normal to all digits. Pedal hair growth normal.  Neurologic Epicritic sensation grossly present.  No signs of compartment syndrome  Dermatologic (Wound) Splint was windowed and has significant edema in the anterior medial ankle from the areas that I can visualize  Orthopedic: Motor intact BLE.  Good range of motion to the digits.    Assessment/Plan: Right trimalleolar ankle fracture PER 4  -No current clinical signs of compartment syndrome, continue to monitor neurovascular status daily -Had initially plan for ORIF tonight but she is beginning to swell more and suspect she will have fracture blistering due to the tenting of the skin medially.  I recommend we delay surgery as an outpatient for 7 to 10 days -She will follow up with my partner Dr. Amalia Hailey in the clinic next Tuesday my office will schedule for our [facility] office.  Plan for surgery following Friday -Radiographs and CT reviewed she has good reduction in the splint, do not think external fixation is necessary at this point -She is at risk of osteoporosis, her vitamin D is quite low and calcium is low as well.  Begin supplementation I have ordered a 50,000 unit dose to take weekly for 8 weeks that she continues through discharge as well as 500 mg calcium twice daily.  Suspect this is essentially a low energy fragility fracture with mechanical insulting injury -Strict nonweightbearing RLE PT is ordered -Continue ice and elevation through discharge, place ice behind knee and elevate leg above heart to keep swelling reduced   Criselda Peaches 04/10/2021, 5:12 PM   Best available via secure chat for questions or concerns.

## 2021-04-10 NOTE — Plan of Care (Signed)
Plan for ORIF this evening with myself and Dr Amalia Hailey after 5pm. CT scan ordered. NPO after breakfast (no solids after 830). Will see around noon for consult. Strict NWB RLE. Ice behind knee and elevate extremity  Lanae Crumbly, DPM 04/10/2021

## 2021-04-10 NOTE — Plan of Care (Signed)
Patient was seen at bedside with her husband and evaluated she is appropriately splinted and has a pillow under the leg and ice behind the knee.  I did do a skin check through the splint and she has a decent amount of swelling and I am concerned for fracture blisters forming considering the tenting of the skin on admission.  Recommend we delay surgery as an outpatient for 7 to 10 days, she will follow-up with Dr. Amalia Hailey in the office on Tuesday plan for surgery Thursday or Friday  PT is ordered nonweightbearing strict RLE Continue ice and elevation, elevate on 2-3 pillows at least above the heart Should be safe for discharge once PT eval is complete Recommend vitamin D check have ordered this to be added to her labs can draw on next lab draw in the morning may need supplementation, she does have low calcium and may be at risk for pathologic fracture secondary to this  Full consult note to follow   Lanae Crumbly, DPM 04/10/2021

## 2021-04-10 NOTE — ED Notes (Signed)
Port x-rays performed

## 2021-04-10 NOTE — Sedation Documentation (Addendum)
X-ray at the bedside to perform post reduction x-rays of the right ankle

## 2021-04-11 ENCOUNTER — Telehealth: Payer: Self-pay | Admitting: Podiatry

## 2021-04-11 DIAGNOSIS — S82891A Other fracture of right lower leg, initial encounter for closed fracture: Secondary | ICD-10-CM

## 2021-04-11 MED ORDER — OXYCODONE HCL 5 MG PO TABS: 5.0000 mg | ORAL_TABLET | Freq: Four times a day (QID) | ORAL | 0 refills | Status: AC | PRN

## 2021-04-11 MED ORDER — ONDANSETRON 8 MG PO TBDP: 8.0000 mg | ORAL_TABLET | Freq: Three times a day (TID) | ORAL | 0 refills | Status: AC | PRN

## 2021-04-11 MED ORDER — CALCIUM CARBONATE ANTACID 500 MG PO CHEW: 1.0000 | CHEWABLE_TABLET | Freq: Two times a day (BID) | ORAL | 0 refills | Status: AC

## 2021-04-11 MED ORDER — VITAMIN D 25 MCG (1000 UNIT) PO TABS: 1000.0000 [IU] | ORAL_TABLET | Freq: Every day | ORAL | 0 refills | Status: AC

## 2021-04-11 NOTE — Telephone Encounter (Signed)
Patient is asking for a wheelchair that can hold her leg up

## 2021-04-11 NOTE — Progress Notes (Signed)
PT Cancellation Note  Patient Details Name: Jill Garrett MRN: [id number] DOB: 1980-10-26   Cancelled Treatment:    Reason Eval/Treat Not Completed: Other (comment) Pt eating breakfast. PT to reassess as able.   [employer], SPT

## 2021-04-11 NOTE — Progress Notes (Signed)
Physical Therapy Treatment Patient Details Name: Jill Garrett MRN: [id number] DOB: Dec 25, 1980 Today's Date: 04/11/2021    History of Present Illness Pt is a 40 y.o. female with medical history significant hypertension presenting to ED following a mechanical fall leading to R trimalleolar fx & dislocation of R ankle.    PT Comments    Pt alert in bed, family present in room throughout treatment. Pt able to come to EOB w/ supervision, HOB elevated 30 degrees, w/o bed rails while managing RLE. Pt transfers w/ supervision, RW demonstrating good technique w/ hands and control. Hopped w/ RW, supervision without LOB and reviewed stair training w/ MIN-A for RW stabilization. Pt did not require cues for hopping, transfers, or bed mobility this session demonstrating progress with mobility. Skilled PT intervention is indicated to address deficits in function, mobility, and to return to PLOF as able.    Follow Up Recommendations  Follow surgeon's recommendation for DC plan and follow-up therapies     Equipment Recommendations  3in1 (PT);Rolling walker with 5" wheels    Recommendations for Other Services       Precautions / Restrictions Precautions Precautions: Fall Required Braces or Orthoses: Splint/Cast Splint/Cast: RLE slint Restrictions Weight Bearing Restrictions: Yes RLE Weight Bearing: Non weight bearing    Mobility  Bed Mobility Overal bed mobility: Needs Assistance Bed Mobility: Supine to Sit     Supine to sit: Supervision     General bed mobility comments: HOB elevated 30 degrees, pt able to scoot to EOB w/o bed rails and manage BLE    Transfers Overall transfer level: Needs assistance Equipment used: Rolling walker (2 wheeled) Transfers: Sit to/from [facility] Sit to Stand: Supervision Stand pivot transfers: Supervision       General transfer comment: Supervision for enviornment set-up, chair follow; good control and hand placement w/  RW  Ambulation/Gait Ambulation/Gait assistance: Supervision Gait Distance (Feet): 35 Feet Assistive device: Rolling walker (2 wheeled)       General Gait Details: Pt hops on LLEw/o LOB while maintaining NWB status   Stairs       Number of Stairs: 2 General stair comments: Min-A for RW stabilization, Pt able to hop 2 steps with RW w/o LOB or instability   Wheelchair Mobility    Modified Rankin (Stroke Patients Only)       Balance Overall balance assessment: Needs assistance Sitting-balance support: Feet unsupported;No upper extremity supported Sitting balance-Leahy Scale: Good     Standing balance support: Bilateral upper extremity supported;During functional activity Standing balance-Leahy Scale: Fair Standing balance comment: Pt requires BUE to maintain NWB status                            Cognition Arousal/Alertness: Awake/alert Behavior During Therapy: WFL for tasks assessed/performed Overall Cognitive Status: Within Functional Limits for tasks assessed                                 General Comments: Cooperative, pleasant, motivated      Exercises Other Exercises Other Exercises: Hopping x 6 w/ RW, min-gaurd    General Comments        Pertinent Vitals/Pain Pain Assessment: 0-10 Pain Score: 5  Pain Location: R ankle Pain Descriptors / Indicators: Aching;Grimacing;Discomfort;Sore Pain Intervention(s): Limited activity within patient's tolerance;Monitored during session;Premedicated before session;Repositioned    Home Living  Prior Function            PT Goals (current goals can now be found in the care plan section) Progress towards PT goals: Progressing toward goals    Frequency    7X/week      PT Plan Current plan remains appropriate    Co-evaluation              AM-PAC PT "6 Clicks" Mobility   Outcome Measure  Help needed turning from your back to your side while in  a flat bed without using bedrails?: A Little Help needed moving from lying on your back to sitting on the side of a flat bed without using bedrails?: A Little Help needed moving to and from a bed to a chair (including a wheelchair)?: A Little Help needed standing up from a chair using your arms (e.g., wheelchair or bedside chair)?: A Little Help needed to walk in hospital room?: A Little Help needed climbing 3-5 steps with a railing? : A Little 6 Click Score: 18    End of Session Equipment Utilized During Treatment: Gait belt Activity Tolerance: Patient tolerated treatment well Patient left: in chair;with call bell/phone within reach;with chair alarm set;with family/visitor present;with SCD's reapplied Nurse Communication: Mobility status PT Visit Diagnosis: Other abnormalities of gait and mobility (R26.89);Muscle weakness (generalized) (M62.81)     Time: [id number] PT Time Calculation (min) (ACUTE ONLY): 42 min  Charges:                       [employer], SPT

## 2021-04-11 NOTE — Discharge Summary (Signed)
Physician Discharge Summary  Jill Garrett [id number] DOB: [birth year] DOA: 04/10/2021  PCP: Patient, No Pcp Per (Inactive)  Admit date: 04/10/2021 Discharge date: 04/11/2021  Admitted From: Home Disposition:  Home  Recommendations for Outpatient Follow-up:  Follow up with PCP in 3-4 weeks Follow up with Podiatry as scheduled   [place] reviewed. No recent controled substances are listed. Limited quantity of oxycodone will be prescribed for acute pain from fracture  Equipment/Devices:3 in 1, rolling walker  Discharge Condition:Stable CODE STATUS:Full Diet recommendation: Regular   Brief/Interim Summary: 39 y.o. female with medical history significant for rosacea and hypertension, who presents to the ER with acute onset of accidental mechanical fall while she was getting out of bed and after taking a couple of steps, noted to have R ankle trimalleolar fracture with dislocation  Discharge Diagnoses:  Active Problems:   Closed right ankle fracture, initial encounter   Closed trimalleolar fracture of right ankle   Dislocation of right ankle joint    1.  Closed right ankle trimalleolar fracture with dislocation status post reduction with persistent significant displacement requiring operative intervention secondary to mechanical fall. - Cont with analgesia as needed -Podiatry consulted initially with plans for surgical management while in hospital - on re-evaluation by Podiatry, pt was noted to have significant swelling, thus new recommendations are noted to delay surgery for 7-10 days as ouptatient -PT ordered with nonweightbearing strict RLE, cont LE elevation, ice -PT recs for rolling walker and 3 in 1 on d/c, have ordered -Pt to follow up with Podiatry as outpatient  2.  Essential hypertension, likely diet managed. - Blood pressure is fairly controlled while in hospital    Discharge Instructions   Allergies as of 04/11/2021   No Known Allergies      Medication List      TAKE these medications    calcium carbonate 500 MG chewable tablet Commonly known as: TUMS - dosed in mg elemental calcium Chew 1 tablet (200 mg of elemental calcium total) by mouth 2 (two) times daily with a meal.   cholecalciferol 25 MCG (1000 UNIT) tablet Commonly known as: VITAMIN D3 Take 1 tablet (1,000 Units total) by mouth daily.   doxycycline 40 MG capsule Commonly known as: Oracea Take 1 capsule (40 mg total) by mouth every morning.   naproxen sodium 550 MG tablet Commonly known as: ANAPROX Take 550 mg by mouth 2 (two) times daily.   ondansetron 8 MG disintegrating tablet Commonly known as: ZOFRAN-ODT Take 1 tablet (8 mg total) by mouth every 8 (eight) hours as needed for nausea or vomiting.   oxyCODONE 5 MG immediate release tablet Commonly known as: Oxy IR/ROXICODONE Take 1 tablet (5 mg total) by mouth every 6 (six) hours as needed for moderate pain.               Durable Medical Equipment  (From admission, onward)           Start     Ordered   04/11/21 0928  For home use only DME Walker rolling  Once       Question Answer Comment  Walker: With 5 Inch Wheels   Patient needs a walker to treat with the following condition Fracture      04/11/21 0928            Follow-up Information     Edrick Kins, DPM .   Specialty: Podiatry Contact information: [place] [address] [phone]  Follow up with your PCP in 3-4 weeks Follow up.   Why: Hospital follow up               No Known Allergies  Consultations: Podiatry  Procedures/Studies: DG Ankle 2 Views Right  Result Date: 04/10/2021 CLINICAL DATA:  Postreduction EXAM: RIGHT ANKLE - 2 VIEW COMPARISON:  04/10/2021 FINDINGS: Interval reduction of the previously seen dislocation of the right ankle. Continued significant displacement across the comminuted distal fibular metadiaphyseal fractures, posterior malleolar fracture and medial malleolar  fractures. IMPRESSION: Interval reduction of the dislocated right ankle. Trimalleolar fracture with continued significant displacement. Electronically Signed   By: Rolm Baptise M.D.   On: 04/10/2021 03:24   DG Ankle Complete Right  Result Date: 04/10/2021 CLINICAL DATA:  Fall, right ankle injury EXAM: RIGHT ANKLE - COMPLETE 3+ VIEW COMPARISON:  None. FINDINGS: Three view radiograph right ankle demonstrates a a comminuted trimalleolar fracture dislocation of the right ankle. A comminuted, segmental fracture of the distal right fibula is seen with a free-floating segment comprised of the distal diaphysis with 1/2 shaft with posterior displacement, override and moderate posterolateral angulation of the distal fracture fragment. A a posterior malleolar fracturel fragment is identified demonstrating roughly 7 mm superior displacement and 1.5 cm lateral displacement of the fracture fragment which comprises roughly 25% of the articular surface. There is a avulsion type medial malleolar fracture at the level of the tibial plafond with the medial malleolar fracture fragment demonstrating lateral displacement by approximately 1.5 cm and mild lateral angulation. There is posterolateral dislocation of the talar dome in relation to the tibial plafond. IMPRESSION: Trimalleolar fracture dislocation of the right ankle as described above. Electronically Signed   By: Fidela Salisbury M.D.   On: 04/10/2021 01:51   CT ANKLE RIGHT WO CONTRAST  Result Date: 04/10/2021 CLINICAL DATA:  Right ankle fracture post reduction. EXAM: CT OF THE RIGHT ANKLE WITHOUT CONTRAST TECHNIQUE: Multidetector CT imaging of the right ankle was performed according to the standard protocol. Multiplanar CT image reconstructions were also generated. COMPARISON:  Radiographs same date. FINDINGS: Bones/Joint/Cartilage The ankle has been splinted. There is improved alignment of the trimalleolar fracture compared with the original radiographs. The tibiotalar  dislocation has been reduced. Segmental fracture of the distal fibular diaphysis remains mildly displaced posteriorly both proximally and distally. The segmental fracture fragment measures up to 11 cm in length. This fracture extends into the anterior aspect of the lateral malleolus where there is comminution. There is a mildly displaced predominantly transverse fracture through the base of the medial malleolus. Fracture of the tibial plafond posteriorly is associated with 5 mm of depression of the articular surface. The tarsal bones appear intact. Ligaments Suboptimally assessed by CT. Muscles and Tendons No evidence of tendon rupture. The peroneal tendons abut the fractures of the distal fibula, but appear intact without entrapment. The posterior tibialis tendon is mildly displaced laterally into the fracture of the posterior malleolus, but appears intact. Soft tissues Moderate soft tissue swelling around the ankle. No focal fluid collection, foreign body or soft tissue emphysema. IMPRESSION: 1. Stable alignment of the trimalleolar fracture status post closed reduction. There is a segmental fracture of the distal fibular diaphysis which remains posteriorly displaced, and there is depression of the articular surface of the tibial plafond posteriorly. 2. Reduced tibiotalar dislocation. No evidence of tarsal bone fracture. 3. The posterior tibialis tendon is laterally displaced into the fracture of the posterior malleolus which could lead to tendon entrapment. No evidence of tendon rupture. Electronically  Signed   By: Richardean Sale M.D.   On: 04/10/2021 08:37   DG Foot 2 Views Right  Result Date: 04/10/2021 CLINICAL DATA:  Fall, right ankle injury EXAM: RIGHT FOOT - 2 VIEW COMPARISON:  None. FINDINGS: Trimalleolar right ankle fracture dislocation is better assessed on accompanying radiographs of the right ankle. Normal alignment of the bones of the right foot. No fracture or dislocation. Joint spaces are  preserved. Soft tissues are unremarkable IMPRESSION: No fracture or dislocation involving the bones of the right foot. Electronically Signed   By: Fidela Salisbury M.D.   On: 04/10/2021 01:52    Subjective: Eager to go home  Discharge Exam: Vitals:   04/11/21 0454 04/11/21 0813  BP: 128/80 126/70  Pulse: 77 69  Resp: 17 18  Temp: 98.6 F (37 C) 98.7 F (37.1 C)  SpO2: 100% 98%   Vitals:   04/10/21 2108 04/10/21 2334 04/11/21 0454 04/11/21 0813  BP: (!) 108/52 126/72 128/80 126/70  Pulse: 80 72 77 69  Resp: '17 16 17 18  '$ Temp: 98.1 F (36.7 C) 98.8 F (37.1 C) 98.6 F (37 C) 98.7 F (37.1 C)  TempSrc: Oral     SpO2: 100% 100% 100% 98%  Weight:      Height:        General: Pt is alert, awake, not in acute distress Cardiovascular: RRR, S1/S2 + Respiratory: CTA bilaterally, no wheezing, no rhonchi Abdominal: Soft, NT, ND, bowel sounds + Extremities: no edema, no cyanosis, RLE with dressings in place   The results of significant diagnostics from this hospitalization (including imaging, microbiology, ancillary and laboratory) are listed below for reference.     Microbiology: Recent Results (from the past 240 hour(s))  Resp Panel by RT-PCR (Flu A&B, Covid) Nasopharyngeal Swab     Status: None   Collection Time: 04/10/21  3:57 AM   Specimen: Nasopharyngeal Swab; Nasopharyngeal(NP) swabs in vial transport medium  Result Value Ref Range Status   SARS Coronavirus 2 by RT PCR NEGATIVE NEGATIVE Final    Comment: (NOTE) SARS-CoV-2 target nucleic acids are NOT DETECTED.  The SARS-CoV-2 RNA is generally detectable in upper respiratory specimens during the acute phase of infection. The lowest concentration of SARS-CoV-2 viral copies this assay can detect is 138 copies/mL. A negative result does not preclude SARS-Cov-2 infection and should not be used as the sole basis for treatment or other patient management decisions. A negative result may occur with  improper specimen  collection/handling, submission of specimen other than nasopharyngeal swab, presence of viral mutation(s) within the areas targeted by this assay, and inadequate number of viral copies(<138 copies/mL). A negative result must be combined with clinical observations, patient history, and epidemiological information. The expected result is Negative.  Fact Sheet for Patients:  EntrepreneurPulse.com.au  Fact Sheet for Healthcare Providers:  IncredibleEmployment.be  This test is no t yet approved or cleared by the [place] FDA and  has been authorized for detection and/or diagnosis of SARS-CoV-2 by FDA under an Emergency Use Authorization (EUA). This EUA will remain  in effect (meaning this test can be used) for the duration of the COVID-19 declaration under Section 564(b)(1) of the Act, 21 U.S.C.section 360bbb-3(b)(1), unless the authorization is terminated  or revoked sooner.       Influenza A by PCR NEGATIVE NEGATIVE Final   Influenza B by PCR NEGATIVE NEGATIVE Final    Comment: (NOTE) The Xpert Xpress SARS-CoV-2/FLU/RSV plus assay is intended as an aid in the diagnosis of influenza from  Nasopharyngeal swab specimens and should not be used as a sole basis for treatment. Nasal washings and aspirates are unacceptable for Xpert Xpress SARS-CoV-2/FLU/RSV testing.  Fact Sheet for Patients: EntrepreneurPulse.com.au  Fact Sheet for Healthcare Providers: IncredibleEmployment.be  This test is not yet approved or cleared by the [place] FDA and has been authorized for detection and/or diagnosis of SARS-CoV-2 by FDA under an Emergency Use Authorization (EUA). This EUA will remain in effect (meaning this test can be used) for the duration of the COVID-19 declaration under Section 564(b)(1) of the Act, 21 U.S.C. section 360bbb-3(b)(1), unless the authorization is terminated or revoked.  Performed at [facility], [address] [place], [place] [address]   Surgical PCR screen     Status: None   Collection Time: 04/10/21 11:09 AM   Specimen: Nasal Mucosa; Nasal Swab  Result Value Ref Range Status   MRSA, PCR NEGATIVE NEGATIVE Final   Staphylococcus aureus NEGATIVE NEGATIVE Final    Comment: (NOTE) The Xpert SA Assay (FDA approved for NASAL specimens in patients 28 years of age and older), is one component of a comprehensive surveillance program. It is not intended to diagnose infection nor to guide or monitor treatment. Performed at [facility], [address] [place], [place] [address]      Labs: BNP (last 3 results) No results for input(s): BNP in the last 8760 hours. Basic Metabolic Panel: Recent Labs  Lab 04/10/21 0117 04/10/21 0613  NA 140 138  K 4.0 4.3  CL 106 107  CO2 27 24  GLUCOSE 117* 122*  BUN 13 11  CREATININE 0.75 0.67  CALCIUM 8.7* 8.5*   Liver Function Tests: Recent Labs  Lab 04/10/21 0117  AST 18  ALT 20  ALKPHOS 78  BILITOT 0.3  PROT 7.4  ALBUMIN 3.9   No results for input(s): LIPASE, AMYLASE in the last 168 hours. No results for input(s): AMMONIA in the last 168 hours. CBC: Recent Labs  Lab 04/10/21 0117 04/10/21 0613  WBC 10.9* 14.1*  NEUTROABS 7.3  --   HGB 12.8 12.5  HCT 38.9 37.5  MCV 85.5 85.0  PLT 291 306   Cardiac Enzymes: No results for input(s): CKTOTAL, CKMB, CKMBINDEX, TROPONINI in the last 168 hours. BNP: Invalid input(s): POCBNP CBG: No results for input(s): GLUCAP in the last 168 hours. D-Dimer No results for input(s): DDIMER in the last 72 hours. Hgb A1c No results for input(s): HGBA1C in the last 72 hours. Lipid Profile No results for input(s): CHOL, HDL, LDLCALC, TRIG, CHOLHDL, LDLDIRECT in the last 72 hours. Thyroid function studies No results for input(s): TSH, T4TOTAL, T3FREE, THYROIDAB in the last 72 hours.  Invalid input(s): FREET3 Anemia work up No results for input(s):  VITAMINB12, FOLATE, FERRITIN, TIBC, IRON, RETICCTPCT in the last 72 hours. Urinalysis No results found for: COLORURINE, APPEARANCEUR, [place], [place], GLUCOSEU, [place], [place], [place], PROTEINUR, UROBILINOGEN, NITRITE, LEUKOCYTESUR Sepsis Labs Invalid input(s): PROCALCITONIN,  WBC,  LACTICIDVEN Microbiology Recent Results (from the past 240 hour(s))  Resp Panel by RT-PCR (Flu A&B, Covid) Nasopharyngeal Swab     Status: None   Collection Time: 04/10/21  3:57 AM   Specimen: Nasopharyngeal Swab; Nasopharyngeal(NP) swabs in vial transport medium  Result Value Ref Range Status   SARS Coronavirus 2 by RT PCR NEGATIVE NEGATIVE Final    Comment: (NOTE) SARS-CoV-2 target nucleic acids are NOT DETECTED.  The SARS-CoV-2 RNA is generally detectable in upper respiratory specimens during the acute phase of infection. The lowest concentration of  SARS-CoV-2 viral copies this assay can detect is 138 copies/mL. A negative result does not preclude SARS-Cov-2 infection and should not be used as the sole basis for treatment or other patient management decisions. A negative result may occur with  improper specimen collection/handling, submission of specimen other than nasopharyngeal swab, presence of viral mutation(s) within the areas targeted by this assay, and inadequate number of viral copies(<138 copies/mL). A negative result must be combined with clinical observations, patient history, and epidemiological information. The expected result is Negative.  Fact Sheet for Patients:  EntrepreneurPulse.com.au  Fact Sheet for Healthcare Providers:  IncredibleEmployment.be  This test is no t yet approved or cleared by the [place] FDA and  has been authorized for detection and/or diagnosis of SARS-CoV-2 by FDA under an Emergency Use Authorization (EUA). This EUA will remain  in effect (meaning this test can be used) for the duration of the COVID-19  declaration under Section 564(b)(1) of the Act, 21 U.S.C.section 360bbb-3(b)(1), unless the authorization is terminated  or revoked sooner.       Influenza A by PCR NEGATIVE NEGATIVE Final   Influenza B by PCR NEGATIVE NEGATIVE Final    Comment: (NOTE) The Xpert Xpress SARS-CoV-2/FLU/RSV plus assay is intended as an aid in the diagnosis of influenza from Nasopharyngeal swab specimens and should not be used as a sole basis for treatment. Nasal washings and aspirates are unacceptable for Xpert Xpress SARS-CoV-2/FLU/RSV testing.  Fact Sheet for Patients: EntrepreneurPulse.com.au  Fact Sheet for Healthcare Providers: IncredibleEmployment.be  This test is not yet approved or cleared by the [place] FDA and has been authorized for detection and/or diagnosis of SARS-CoV-2 by FDA under an Emergency Use Authorization (EUA). This EUA will remain in effect (meaning this test can be used) for the duration of the COVID-19 declaration under Section 564(b)(1) of the Act, 21 U.S.C. section 360bbb-3(b)(1), unless the authorization is terminated or revoked.  Performed at [facility], [address] [place], [place] [address]   Surgical PCR screen     Status: None   Collection Time: 04/10/21 11:09 AM   Specimen: Nasal Mucosa; Nasal Swab  Result Value Ref Range Status   MRSA, PCR NEGATIVE NEGATIVE Final   Staphylococcus aureus NEGATIVE NEGATIVE Final    Comment: (NOTE) The Xpert SA Assay (FDA approved for NASAL specimens in patients 55 years of age and older), is one component of a comprehensive surveillance program. It is not intended to diagnose infection nor to guide or monitor treatment. Performed at [facility], [address], [place], [place] [address]    Time spent: 30 min  SIGNED:   Marylu Lund, MD  [facility] 04/11/2021, 9:49 AM  If 7PM-7AM, please contact night-coverage

## 2021-04-11 NOTE — TOC Initial Note (Addendum)
Transition of Care [facility]) - Initial/Assessment Note    Patient Details  Name: Jill Garrett MRN: [id number] Date of Birth: 12-13-1980  Transition of Care [facility] CM/SW Contact:    Su Hilt, RN Phone Number: 04/11/2021, 9:29 AM  Clinical Narrative:                 Met with the patient and her mother in the room at the bedside She has transportation and can afford her medications She will need a RW and 3 in 1, Rhonda with [employer] will bring to the room prior to DC,  She is to have surgery outpatient in 7-14 days once the swelling goes down.  No home health is set up at this time until after surgery to determine what her needs are. No additional TOC needs at this time        Patient Goals and CMS Choice        Expected Discharge Plan and Services                                                Prior Living Arrangements/Services                       Activities of Daily Living Home Assistive Devices/Equipment: None ADL Screening (condition at time of admission) Patient's cognitive ability adequate to safely complete daily activities?: Yes Is the patient deaf or have difficulty hearing?: No Does the patient have difficulty seeing, even when wearing glasses/contacts?: No Does the patient have difficulty concentrating, remembering, or making decisions?: No Patient able to express need for assistance with ADLs?: Yes Does the patient have difficulty dressing or bathing?: No Independently performs ADLs?: Yes (appropriate for developmental age) Does the patient have difficulty walking or climbing stairs?: No Weakness of Legs: None Weakness of Arms/Hands: None  Permission Sought/Granted                  Emotional Assessment              Admission diagnosis:  Fall [W19.XXXA] Dislocation of right ankle joint, initial encounter [Y86.57QI] Closed trimalleolar fracture of right ankle, initial encounter [S82.851A] Closed right ankle fracture,  initial encounter [S82.891A] Patient Active Problem List   Diagnosis Date Noted   Closed right ankle fracture, initial encounter 04/10/2021   Closed trimalleolar fracture of right ankle    Dislocation of right ankle joint    PCP:  Patient, No Pcp Per (Inactive) Pharmacy:   [id number] [id number]: [phone]: [phone]    Social Determinants of Health (SDOH) Interventions    Readmission Risk Interventions No flowsheet data found.

## 2021-04-16 ENCOUNTER — Ambulatory Visit: Payer: BC Managed Care – PPO | Admitting: Podiatry

## 2021-05-10 ENCOUNTER — Other Ambulatory Visit: Payer: Self-pay | Admitting: Obstetrics and Gynecology

## 2021-05-10 DIAGNOSIS — Z1231 Encounter for screening mammogram for malignant neoplasm of breast: Secondary | ICD-10-CM

## 2021-08-08 ENCOUNTER — Other Ambulatory Visit: Payer: Self-pay

## 2021-08-08 ENCOUNTER — Ambulatory Visit
Admission: RE | Admit: 2021-08-08 | Discharge: 2021-08-08 | Disposition: A | Discharge status: Home / Self Care | Payer: BC Managed Care – PPO | Source: Ambulatory Visit | Attending: Obstetrics and Gynecology | Admitting: Obstetrics and Gynecology

## 2021-08-08 DIAGNOSIS — Z1231 Encounter for screening mammogram for malignant neoplasm of breast: Secondary | ICD-10-CM | POA: Diagnosis present

## 2022-01-07 ENCOUNTER — Ambulatory Visit: Payer: BC Managed Care – PPO | Admitting: Dermatology

## 2022-01-07 DIAGNOSIS — L821 Other seborrheic keratosis: Secondary | ICD-10-CM | POA: Diagnosis not present

## 2022-01-07 DIAGNOSIS — L578 Other skin changes due to chronic exposure to nonionizing radiation: Secondary | ICD-10-CM | POA: Diagnosis not present

## 2022-01-07 DIAGNOSIS — L814 Other melanin hyperpigmentation: Secondary | ICD-10-CM | POA: Diagnosis not present

## 2022-01-07 DIAGNOSIS — L719 Rosacea, unspecified: Secondary | ICD-10-CM

## 2022-01-07 MED ORDER — IVERMECTIN 1 % EX CREA: 1.0000 "application " | TOPICAL_CREAM | Freq: Every day | CUTANEOUS | 11 refills | Status: DC

## 2022-01-07 NOTE — Progress Notes (Signed)
   Follow-Up Visit   Subjective  Jill Garrett is a 41 y.o. female who presents for the following: Rosacea (1 year rosacea follow up, currently on soolantra nightly and has been taking Oracea 40 mg qd with food. Reports still taking soolantra and would like refills, has not been taking oracea since September. Reports redness has improved since on recent regimen. /) and Other (Spot at right temple would like checked. ).  The patient has spots, moles and lesions to be evaluated, some may be new or changing and the patient has concerns that these could be cancer.   The following portions of the chart were reviewed this encounter and updated as appropriate:      Review of Systems: No other skin or systemic complaints except as noted in HPI or Assessment and Plan.   Objective  Well appearing patient in no apparent distress; mood and affect are within normal limits.  A focused examination was performed including face . Relevant physical exam findings are noted in the Assessment and Plan.  face Mild erythema at nose and malar cheeks    Assessment & Plan  Rosacea face  Chronic condition with duration or expected duration over one year. Currently well-controlled.   Rosacea is a chronic progressive skin condition usually affecting the face of adults, causing redness and/or acne bumps. It is treatable but not curable. It sometimes affects the eyes (ocular rosacea) as well. It may respond to topical and/or systemic medication and can flare with stress, sun exposure, alcohol, exercise and some foods.  Daily application of broad spectrum spf 30+ sunscreen to face is recommended to reduce flares.   Continue Soolantra cream QHS Patient can stop Oracea unless needed for flares, Oracea '40mg'$  1 PO QD with food. Will call for rfs  Continue CeraVe AM Doxycycline should be taken with food to prevent nausea. Do not lay down for 30 minutes after taking. Be cautious with sun exposure and use good sun  protection while on this medication. Pregnant women should not take this medication.    Ivermectin (SOOLANTRA) 1 % CREA - face Apply 1 application. topically at bedtime.  Related Medications doxycycline (ORACEA) 40 MG capsule Take 1 capsule (40 mg total) by mouth every morning.   Seborrheic Keratoses - Stuck-on, waxy, tan-brown papule at right temple  - Benign-appearing - Discussed benign etiology and prognosis. - Observe - Call for any changes  Lentigines - Scattered tan macules - Due to sun exposure - Benign-appering, observe - Recommend daily broad spectrum sunscreen SPF 30+ to sun-exposed areas, reapply every 2 hours as needed. - Call for any changes  Actinic Damage - chronic, secondary to cumulative UV radiation exposure/sun exposure over time - diffuse scaly erythematous macules with underlying dyspigmentation - Recommend daily broad spectrum sunscreen SPF 30+ to sun-exposed areas, reapply every 2 hours as needed.  - Recommend staying in the shade or wearing long sleeves, sun glasses (UVA+UVB protection) and wide brim hats (4-inch brim around the entire circumference of the hat). - Call for new or changing lesions.  Return in about 1 year (around 01/08/2023) for rosacea. I, Ruthell Rummage, CMA, am acting as scribe for Brendolyn Patty, MD.  Documentation: I have reviewed the above documentation for accuracy and completeness, and I agree with the above.  Brendolyn Patty MD

## 2022-01-07 NOTE — Patient Instructions (Addendum)
Rosacea  What is rosacea? Rosacea (say: ro-zay-sha) is a common skin disease that usually begins as a trend of flushing or blushing easily.  As rosacea progresses, a persistent redness in the center of the face will develop and may gradually spread beyond the nose and cheeks to the forehead and chin.  In some cases, the ears, chest, and back could be affected.  Rosacea may appear as tiny blood vessels or small red bumps that occur in crops.  Frequently they can contain pus, and are called "pustules".  If the bumps do not contain pus, they are referred to as "papules".  Rarely, in prolonged, untreated cases of rosacea, the oil glands of the nose and cheeks may become permanently enlarged.  This is called rhinophyma, and is seen more frequently in men.  Signs and Risks In its beginning stages, rosacea tends to come and go, which makes it difficult to recognize.  It can start as intermittent flushing of the face.  Eventually, blood vessels may become permanently visible.  Pustules and papules can appear, but can be mistaken for adult acne.  People of all races, ages, genders and ethnic groups are at risk of developing rosacea.  However, it is more common in women (especially around menopause) and adults with fair skin between the ages of 3 and 87.  Treatment Dermatologists typically recommend a combination of treatments to effectively manage rosacea.  Treatment can improve symptoms and may stop the progression of the rosacea.  Treatment may involve both topical and oral medications.  The tetracycline antibiotics are often used for their anti-inflammatory effect; however, because of the possibility of developing antibiotic resistance, they should not be used long term at full dose.  For dilated blood vessels the options include electrodessication (uses electric current through a small needle), laser treatment, and cosmetics to hide the redness.   With all forms of treatment, improvement is a slow process, and  patients may not see any results for the first 3-4 weeks.  It is very important to avoid the sun and other triggers.  Patients must wear sunscreen daily.  Skin Care Instructions: Cleanse the skin with a mild soap such as CeraVe cleanser, Cetaphil cleanser, or Dove soap once or twice daily as needed. Moisturize with Eucerin Redness Relief Daily Perfecting Lotion (has a subtle green tint), CeraVe Moisturizing Cream, or Oil of Olay Daily Moisturizer with sunscreen every morning and/or night as recommended. Makeup should be "non-comedogenic" (won't clog pores) and be labeled "for sensitive skin". Good choices for cosmetics are: Neutrogena, Almay, and Physician's Formula.  Any product with a green tint tends to offset a red complexion. If your eyes are dry and irritated, use artificial tears 2-3 times per day and cleanse the eyelids daily with baby shampoo.  Have your eyes examined at least every 2 years.  Be sure to tell your eye doctor that you have rosacea. Alcoholic beverages tend to cause flushing of the skin, and may make rosacea worse. Always wear sunscreen, protect your skin from extreme hot and cold temperatures, and avoid spicy foods, hot drinks, and mechanical irritation such as rubbing, scrubbing, or massaging the face.  Avoid harsh skin cleansers, cleansing masks, astringents, and exfoliation. If a particular product burns or makes your face feel tight, then it is likely to flare your rosacea. If you are having difficulty finding a sunscreen that you can tolerate, you may try switching to a chemical-free sunscreen.  These are ones whose active ingredient is zinc oxide or titanium dioxide  only.  They should also be fragrance free, non-comedogenic, and labeled for sensitive skin. Rosacea triggers may vary from person to person.  There are a variety of foods that have been reported to trigger rosacea.  Some patients find that keeping a diary of what they were doing when they flared helps them avoid  triggers.      Seborrheic Keratosis  What causes seborrheic keratoses? Seborrheic keratoses are harmless, common skin growths that first appear during adult life.  As time goes by, more growths appear.  Some people may develop a large number of them.  Seborrheic keratoses appear on both covered and uncovered body parts.  They are not caused by sunlight.  The tendency to develop seborrheic keratoses can be inherited.  They vary in color from skin-colored to gray, brown, or even black.  They can be either smooth or have a rough, warty surface.   Seborrheic keratoses are superficial and look as if they were stuck on the skin.  Under the microscope this type of keratosis looks like layers upon layers of skin.  That is why at times the top layer may seem to fall off, but the rest of the growth remains and re-grows.    Treatment Seborrheic keratoses do not need to be treated, but can easily be removed in the office.  Seborrheic keratoses often cause symptoms when they rub on clothing or jewelry.  Lesions can be in the way of shaving.  If they become inflamed, they can cause itching, soreness, or burning.  Removal of a seborrheic keratosis can be accomplished by freezing, burning, or surgery. If any spot bleeds, scabs, or grows rapidly, please return to have it checked, as these can be an indication of a skin cancer.   Due to recent changes in healthcare laws, you may see results of your pathology and/or laboratory studies on MyChart before the doctors have had a chance to review them. We understand that in some cases there may be results that are confusing or concerning to you. Please understand that not all results are received at the same time and often the doctors may need to interpret multiple results in order to provide you with the best plan of care or course of treatment. Therefore, we ask that you please give [place] 2 business days to thoroughly review all your results before contacting the office for  clarification. Should we see a critical lab result, you will be contacted sooner.   If You Need Anything After Your Visit  If you have any questions or concerns for your doctor, please call our main line at [phone] and press option 4 to reach your doctor's medical assistant. If no one answers, please leave a voicemail as directed and we will return your call as soon as possible. Messages left after 4 pm will be answered the following business day.   You may also send [place] a message via [place]. We typically respond to MyChart messages within 1-2 business days.  For prescription refills, please ask your pharmacy to contact our office. Our fax number is [phone].  If you have an urgent issue when the clinic is closed that cannot wait until the next business day, you can page your doctor at the number below.    Please note that while we do our best to be available for urgent issues outside of office hours, we are not available 24/7.   If you have an urgent issue and are unable to reach [place], you may choose to  seek medical care at your doctor's office, retail clinic, urgent care center, or emergency room.  If you have a medical emergency, please immediately call 911 or go to the emergency department.  Pager Numbers  - Dr. Nehemiah Massed: [phone]  - Dr. Laurence Ferrari: [phone]  - Dr. Nicole Kindred: [phone]  In the event of inclement weather, please call our main line at [phone] for an update on the status of any delays or closures.  Dermatology Medication Tips: Please keep the boxes that topical medications come in in order to help keep track of the instructions about where and how to use these. Pharmacies typically print the medication instructions only on the boxes and not directly on the medication tubes.   If your medication is too expensive, please contact our office at [phone] option 4 or send [place] a message through [place].   We are unable to tell what your co-pay for  medications will be in advance as this is different depending on your insurance coverage. However, we may be able to find a substitute medication at lower cost or fill out paperwork to get insurance to cover a needed medication.   If a prior authorization is required to get your medication covered by your insurance company, please allow [place] 1-2 business days to complete this process.  Drug prices often vary depending on where the prescription is filled and some pharmacies may offer cheaper prices.  The website www.goodrx.com contains coupons for medications through different pharmacies. The prices here do not account for what the cost may be with help from insurance (it may be cheaper with your insurance), but the website can give you the price if you did not use any insurance.  - You can print the associated coupon and take it with your prescription to the pharmacy.  - You may also stop by our office during regular business hours and pick up a GoodRx coupon card.  - If you need your prescription sent electronically to a different pharmacy, notify our office through [facility] or by phone at [phone] option 4.     Si Usted Necesita Algo Despus de Su Visita  Tambin puede enviarnos un mensaje a travs de Pharmacist, community. Por lo general respondemos a los mensajes de MyChart en el transcurso de 1 a 2 das hbiles.  Para renovar recetas, por favor pida a su farmacia que se ponga en contacto con nuestra oficina. Harland Dingwall de fax es [place] [phone].  Si tiene un asunto urgente cuando la clnica est cerrada y que no puede esperar hasta el siguiente da hbil, puede llamar/localizar a su doctor(a) al nmero que aparece a continuacin.   Por favor, tenga en cuenta que aunque hacemos todo lo posible para estar disponibles para asuntos urgentes fuera del horario de [place], no estamos disponibles las 24 horas del da, los 7 das de la [place].   Si tiene un problema urgente y no puede  comunicarse con nosotros, puede optar por buscar atencin mdica  en el consultorio de su doctor(a), en una clnica privada, en un centro de atencin urgente o en una sala de emergencias.  Si tiene Engineering geologist, por favor llame inmediatamente al 911 o vaya a la sala de emergencias.  Nmeros de bper  - Dr. Nehemiah Massed: [phone]  - Dra. Moye: [phone]  - Dra. Nicole Kindred: [phone]  En caso de inclemencias del [place], por favor llame a Johnsie Kindred principal al [phone] para una actualizacin sobre el [place] de cualquier retraso o cierre.  Consejos para  la medicacin en dermatologa: Por favor, guarde las cajas en las que vienen los medicamentos de uso tpico para ayudarle a seguir las instrucciones sobre dnde y cmo usarlos. Las farmacias generalmente imprimen las instrucciones del medicamento slo en las cajas y no directamente en los tubos del [place].   Si su medicamento es muy caro, por favor, pngase en contacto con Zigmund Daniel llamando al [phone] y presione la opcin 4 o envenos un mensaje a travs de Pharmacist, community.   No podemos decirle cul ser su copago por los medicamentos por adelantado ya que esto es diferente dependiendo de la cobertura de su seguro. Sin embargo, es posible que podamos encontrar un medicamento sustituto a Electrical engineer un formulario para que el seguro cubra el medicamento que se considera necesario.   Si se requiere una autorizacin previa para que su compaa de seguros [place] su medicamento, por favor permtanos de 1 a 2 das hbiles para completar este proceso.  Los precios de los medicamentos varan con frecuencia dependiendo del Environmental consultant de dnde se surte la receta y alguna farmacias pueden ofrecer precios ms baratos.  El sitio web www.goodrx.com tiene cupones para medicamentos de Airline pilot. Los precios aqu no tienen en cuenta lo que podra costar con la ayuda del seguro (puede ser ms barato con su seguro), pero  el sitio web puede darle el precio si no utiliz Research scientist (physical sciences).  - Puede imprimir el cupn correspondiente y llevarlo con su receta a la farmacia.  - Tambin puede pasar por nuestra oficina durante el horario de atencin regular y Charity fundraiser una tarjeta de cupones de GoodRx.  - Si necesita que su receta se enve electrnicamente a una farmacia diferente, informe a nuestra oficina a travs de MyChart de [place] o por telfono llamando al [phone] y presione la opcin 4.

## 2022-07-04 ENCOUNTER — Other Ambulatory Visit: Payer: Self-pay | Admitting: Obstetrics and Gynecology

## 2022-07-04 DIAGNOSIS — Z1231 Encounter for screening mammogram for malignant neoplasm of breast: Secondary | ICD-10-CM

## 2022-08-28 ENCOUNTER — Ambulatory Visit
Admission: RE | Admit: 2022-08-28 | Discharge: 2022-08-28 | Disposition: A | Discharge status: Home / Self Care | Payer: BC Managed Care – PPO | Source: Ambulatory Visit | Attending: Obstetrics and Gynecology | Admitting: Obstetrics and Gynecology

## 2022-08-28 DIAGNOSIS — Z1231 Encounter for screening mammogram for malignant neoplasm of breast: Secondary | ICD-10-CM | POA: Insufficient documentation

## 2023-01-12 ENCOUNTER — Ambulatory Visit: Payer: BC Managed Care – PPO | Admitting: Dermatology

## 2023-01-12 ENCOUNTER — Encounter: Payer: Self-pay | Admitting: Dermatology

## 2023-01-12 VITALS — BP 122/81 | HR 76

## 2023-01-12 DIAGNOSIS — L719 Rosacea, unspecified: Secondary | ICD-10-CM | POA: Diagnosis not present

## 2023-01-12 DIAGNOSIS — L255 Unspecified contact dermatitis due to plants, except food: Secondary | ICD-10-CM

## 2023-01-12 DIAGNOSIS — L309 Dermatitis, unspecified: Secondary | ICD-10-CM

## 2023-01-12 MED ORDER — CLOBETASOL PROPIONATE 0.05 % EX CREA
1.0000 | TOPICAL_CREAM | Freq: Two times a day (BID) | CUTANEOUS | 1 refills | Status: AC
Start: 2023-01-12 — End: ?

## 2023-01-12 MED ORDER — IVERMECTIN 1 % EX CREA: 1.0000 "application " | TOPICAL_CREAM | Freq: Every day | CUTANEOUS | 11 refills | Status: DC

## 2023-01-12 NOTE — Progress Notes (Signed)
   Follow-Up Visit   Subjective  Jill Garrett is a 42 y.o. female who presents for the following: 1 year rosacea follow up, currently using soolantra qhs. Doing well. Patient also reports an itchy rash at right arm and would like rx. Thinks it is poison ivy.     The following portions of the chart were reviewed this encounter and updated as appropriate: medications, allergies, medical history  Review of Systems:  No other skin or systemic complaints except as noted in HPI or Assessment and Plan.  Objective  Well appearing patient in no apparent distress; mood and affect are within normal limits.  Areas Examined: Face and right arm  Relevant physical exam findings are noted in the Assessment and Plan.  right arm Pink edematous papules on right arm    Assessment & Plan   Contact dermatitis due to plants, except food, unspecified contact dermatitis type right arm  Start Clobetasol cream 0.05 % - apply topically to aa of rash bid prn. Avoid applying to face, groin, and axilla. Use as directed. Long-term use can cause thinning of the skin.  Topical steroids (such as triamcinolone, fluocinolone, fluocinonide, mometasone, clobetasol, halobetasol, betamethasone, hydrocortisone) can cause thinning and lightening of the skin if they are used for too long in the same area. Your physician has selected the right strength medicine for your problem and area affected on the body. Please use your medication only as directed by your physician to prevent side effects.   clobetasol cream (TEMOVATE) 0.05 % - right arm Apply 1 Application topically 2 (two) times daily. For itchy rash or bites. Avoid applying to face, groin, and axilla. Use as directed.  Rosacea  Related Medications doxycycline (ORACEA) 40 MG capsule Take 1 capsule (40 mg total) by mouth every morning.  Ivermectin (SOOLANTRA) 1 % CREA Apply 1 application  topically at bedtime.   ROSACEA  Exam:  mild erythema on nose and  malar cheeks  Chronic condition with duration or expected duration over one year. Currently well-controlled.   Rosacea is a chronic progressive skin condition usually affecting the face of adults, causing redness and/or acne bumps. It is treatable but not curable. It sometimes affects the eyes (ocular rosacea) as well. It may respond to topical and/or systemic medication and can flare with stress, sun exposure, alcohol, exercise, topical steroids (including hydrocortisone/cortisone 10) and some foods.  Daily application of broad spectrum spf 30+ sunscreen to face is recommended to reduce flares.  Treatment Plan Continue soolantra cream qhs   Recommend spf in morning especially in summer   Return if symptoms worsen or fail to improve.  I, Asher Muir, CMA, am acting as scribe for Willeen Niece, MD.   Documentation: I have reviewed the above documentation for accuracy and completeness, and I agree with the above.  Willeen Niece, MD

## 2023-01-12 NOTE — Patient Instructions (Addendum)
Recommend cerave moisturizer am with spf in morning       Due to recent changes in healthcare laws, you may see results of your pathology and/or laboratory studies on MyChart before the doctors have had a chance to review them. We understand that in some cases there may be results that are confusing or concerning to you. Please understand that not all results are received at the same time and often the doctors may need to interpret multiple results in order to provide you with the best plan of care or course of treatment. Therefore, we ask that you please give [place] 2 business days to thoroughly review all your results before contacting the office for clarification. Should we see a critical lab result, you will be contacted sooner.   If You Need Anything After Your Visit  If you have any questions or concerns for your doctor, please call our main line at [phone] and press option 4 to reach your doctor's medical assistant. If no one answers, please leave a voicemail as directed and we will return your call as soon as possible. Messages left after 4 pm will be answered the following business day.   You may also send [place] a message via MyChart. We typically respond to MyChart messages within 1-2 business days.  For prescription refills, please ask your pharmacy to contact our office. Our fax number is [phone].  If you have an urgent issue when the clinic is closed that cannot wait until the next business day, you can page your doctor at the number below.    Please note that while we do our best to be available for urgent issues outside of office hours, we are not available 24/7.   If you have an urgent issue and are unable to reach [place], you may choose to seek medical care at your doctor's office, retail clinic, urgent care center, or emergency room.  If you have a medical emergency, please immediately call 911 or go to the emergency department.  Pager Numbers  - Dr. Gwen Pounds:  [phone]  - Dr. Neale Burly: [phone]  - Dr. Roseanne Reno: [phone]  In the event of inclement weather, please call our main line at [phone] for an update on the status of any delays or closures.  Dermatology Medication Tips: Please keep the boxes that topical medications come in in order to help keep track of the instructions about where and how to use these. Pharmacies typically print the medication instructions only on the boxes and not directly on the medication tubes.   If your medication is too expensive, please contact our office at [phone] option 4 or send [place] a message through MyChart.   We are unable to tell what your co-pay for medications will be in advance as this is different depending on your insurance coverage. However, we may be able to find a substitute medication at lower cost or fill out paperwork to get insurance to cover a needed medication.   If a prior authorization is required to get your medication covered by your insurance company, please allow [place] 1-2 business days to complete this process.  Drug prices often vary depending on where the prescription is filled and some pharmacies may offer cheaper prices.  The website www.goodrx.com contains coupons for medications through different pharmacies. The prices here do not account for what the cost may be with help from insurance (it may be cheaper with your insurance), but the website can give you the price if you did not use  any insurance.  - You can print the associated coupon and take it with your prescription to the pharmacy.  - You [place] also stop by our office during regular business hours and pick up a GoodRx coupon card.  - If you need your prescription sent electronically to a different pharmacy, notify our office through [facility] or by phone at [phone] option 4.     Si Usted Necesita Algo Despus de Su Visita  Tambin puede enviarnos un mensaje a travs de Pharmacist, community. Por lo general  respondemos a los mensajes de MyChart en el transcurso de 1 a 2 das hbiles.  Para renovar recetas, por favor pida a su farmacia que se ponga en contacto con nuestra oficina. Harland Dingwall de fax es [phone].  Si tiene un asunto urgente cuando la clnica est cerrada y que no puede esperar hasta el siguiente da hbil, puede llamar/localizar a su doctor(a) al nmero que aparece a continuacin.   Por favor, tenga en cuenta que aunque hacemos todo lo posible para estar disponibles para asuntos urgentes fuera del horario de [place], no estamos disponibles las 24 horas del da, los 7 das de la [place].   Si tiene un problema urgente y no puede comunicarse con nosotros, puede optar por buscar atencin mdica  en el consultorio de su doctor(a), en una clnica privada, en un centro de atencin urgente o en una sala de emergencias.  Si tiene Engineering geologist, por favor llame inmediatamente al 911 o vaya a la sala de emergencias.  Nmeros de bper  - Dr. Nehemiah Massed: [phone]  - Dra. Moye: [phone]  - Dra. Nicole Kindred: [phone]  En caso de inclemencias del [place], por favor llame a Johnsie Kindred principal al [phone] para una actualizacin sobre el [place] de cualquier retraso o cierre.  Consejos para la medicacin en dermatologa: Por favor, guarde las cajas en las que vienen los medicamentos de uso tpico para ayudarle a seguir las instrucciones sobre dnde y cmo usarlos. Las farmacias generalmente imprimen las instrucciones del medicamento slo en las cajas y no directamente en los tubos del [place].   Si su medicamento es muy caro, por favor, pngase en contacto con Zigmund Daniel llamando al [phone] y presione la opcin 4 o envenos un mensaje a travs de Pharmacist, community.   No podemos decirle cul ser su copago por los medicamentos por adelantado ya que esto es diferente dependiendo de la cobertura de su seguro. Sin embargo, es posible que podamos encontrar un  medicamento sustituto a Electrical engineer un formulario para que el seguro cubra el medicamento que se considera necesario.   Si se requiere una autorizacin previa para que su compaa de seguros [place] su medicamento, por favor permtanos de 1 a 2 das hbiles para completar este proceso.  Los precios de los medicamentos varan con frecuencia dependiendo del Environmental consultant de dnde se surte la receta y alguna farmacias pueden ofrecer precios ms baratos.  El sitio web www.goodrx.com tiene cupones para medicamentos de Airline pilot. Los precios aqu no tienen en cuenta lo que podra costar con la ayuda del seguro (puede ser ms barato con su seguro), pero el sitio web puede darle el precio si no utiliz Research scientist (physical sciences).  - Puede imprimir el cupn correspondiente y llevarlo con su receta a la farmacia.  - Tambin puede pasar por nuestra oficina durante el horario de atencin regular y Charity fundraiser una tarjeta de cupones de GoodRx.  - Si necesita que su receta se enve electrnicamente a  una farmacia diferente, informe a nuestra oficina a travs de MyChart de [place] o por telfono llamando al [phone] y presione la opcin 4.

## 2023-01-27 ENCOUNTER — Ambulatory Visit: Payer: BC Managed Care – PPO | Admitting: Dermatology

## 2023-07-21 ENCOUNTER — Other Ambulatory Visit: Payer: Self-pay | Admitting: Certified Nurse Midwife

## 2023-07-21 DIAGNOSIS — Z1231 Encounter for screening mammogram for malignant neoplasm of breast: Secondary | ICD-10-CM

## 2023-08-31 ENCOUNTER — Ambulatory Visit
Admission: RE | Admit: 2023-08-31 | Discharge: 2023-08-31 | Disposition: A | Discharge status: Home / Self Care | Payer: BC Managed Care – PPO | Source: Ambulatory Visit | Attending: Certified Nurse Midwife | Admitting: Certified Nurse Midwife

## 2023-08-31 DIAGNOSIS — Z1231 Encounter for screening mammogram for malignant neoplasm of breast: Secondary | ICD-10-CM | POA: Diagnosis present

## 2023-09-03 ENCOUNTER — Other Ambulatory Visit: Payer: Self-pay | Admitting: Internal Medicine

## 2023-09-03 DIAGNOSIS — E782 Mixed hyperlipidemia: Secondary | ICD-10-CM

## 2023-09-03 DIAGNOSIS — Z0000 Encounter for general adult medical examination without abnormal findings: Secondary | ICD-10-CM

## 2023-09-21 ENCOUNTER — Other Ambulatory Visit: Payer: BC Managed Care – PPO

## 2023-09-29 ENCOUNTER — Ambulatory Visit
Admission: RE | Admit: 2023-09-29 | Discharge: 2023-09-29 | Disposition: A | Discharge status: Home / Self Care | Payer: Self-pay | Source: Ambulatory Visit | Attending: Internal Medicine | Admitting: Internal Medicine

## 2023-09-29 DIAGNOSIS — E782 Mixed hyperlipidemia: Secondary | ICD-10-CM | POA: Insufficient documentation

## 2023-09-29 DIAGNOSIS — Z0000 Encounter for general adult medical examination without abnormal findings: Secondary | ICD-10-CM | POA: Insufficient documentation

## 2023-11-29 IMAGING — MG MM DIGITAL SCREENING BILAT W/ TOMO AND CAD
8 series · 8 of 24 positions shown · non-contrast
Comparison: Previous exam(s).

CLINICAL DATA: Screening.

EXAM:
DIGITAL SCREENING BILATERAL MAMMOGRAM WITH TOMOSYNTHESIS AND CAD
TECHNIQUE: Bilateral screening digital craniocaudal and mediolateral oblique
mammograms were obtained. Bilateral screening digital breast
tomosynthesis was performed. The images were evaluated with
computer-aided detection.

[L CC synth-2D]
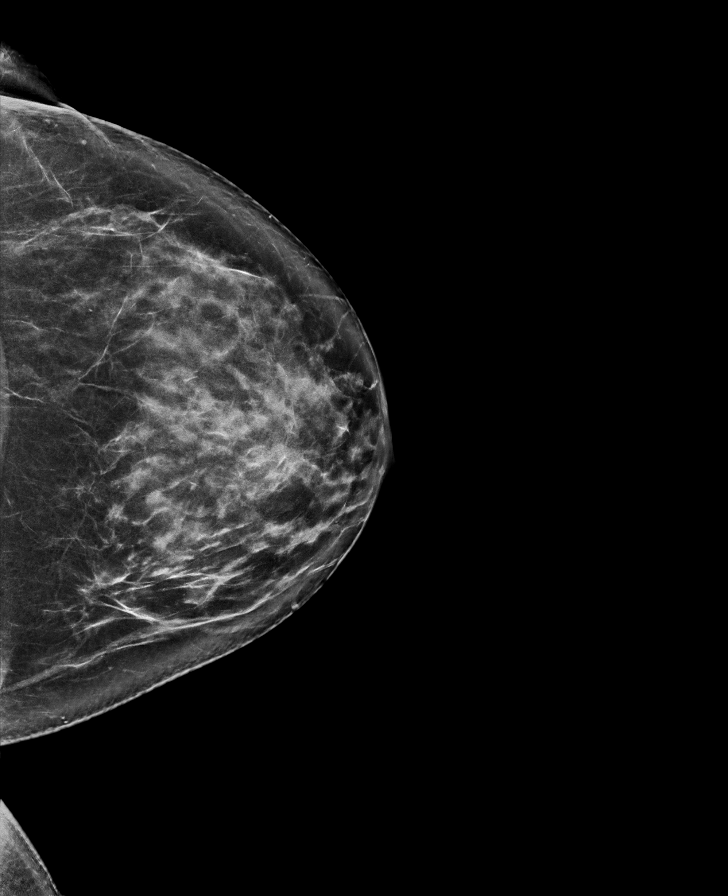

[R CC synth-2D]
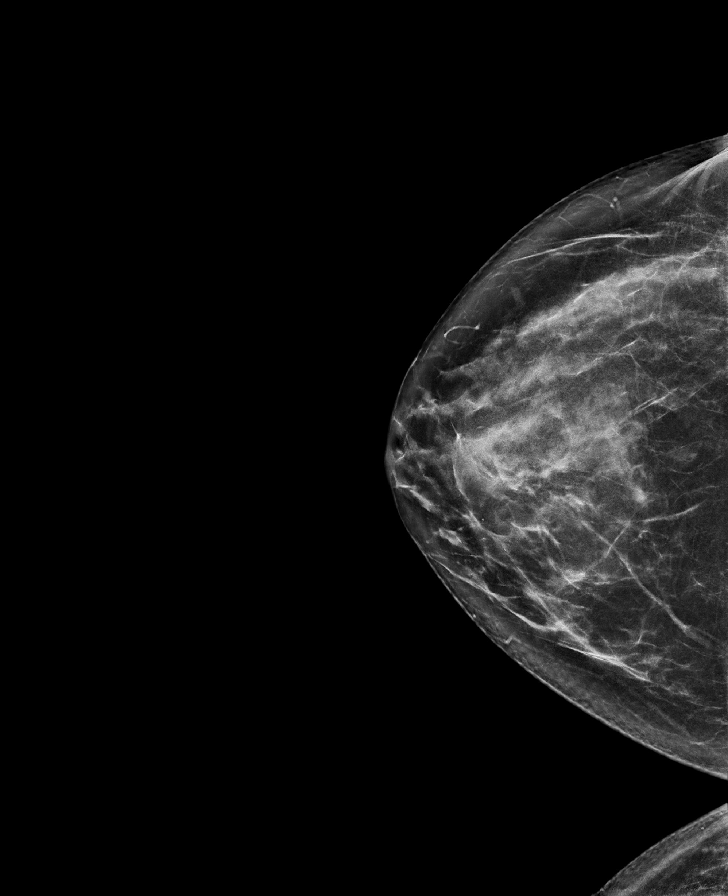

[L MLO synth-2D]
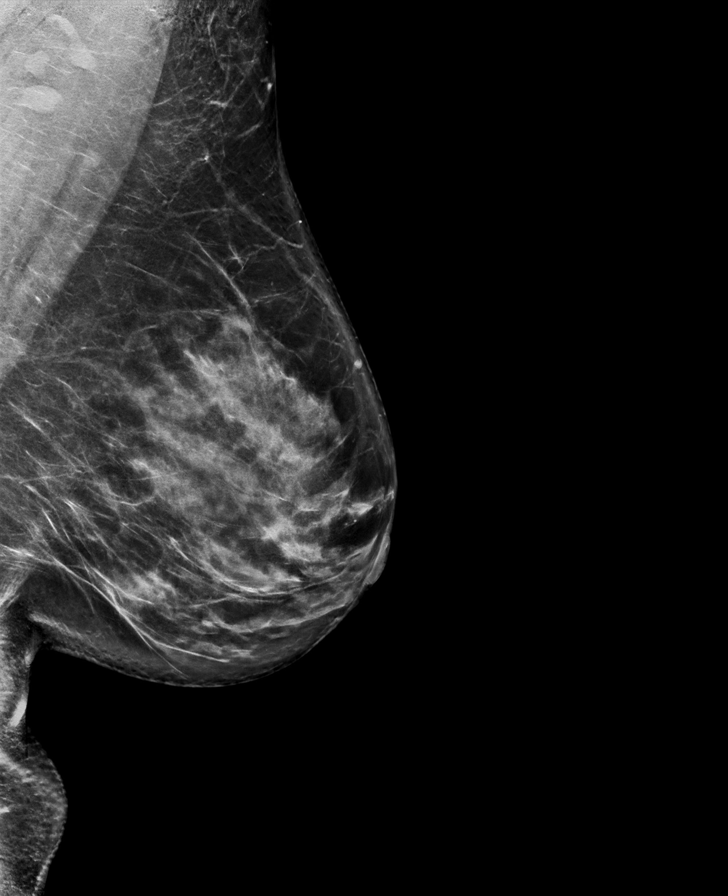

[R MLO synth-2D]
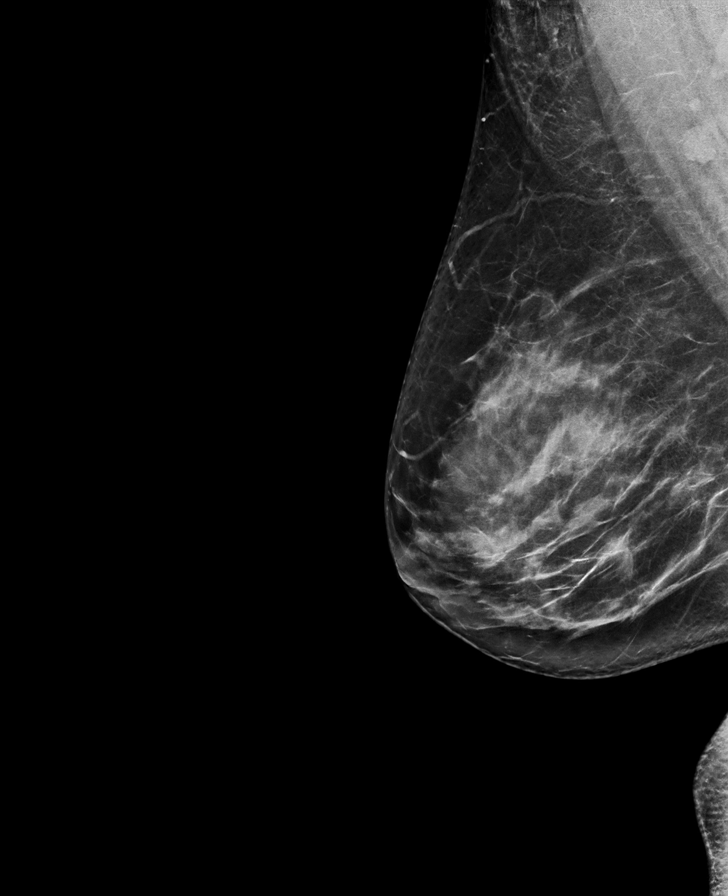

[L CC tomo · tomo slice 41/81.0]
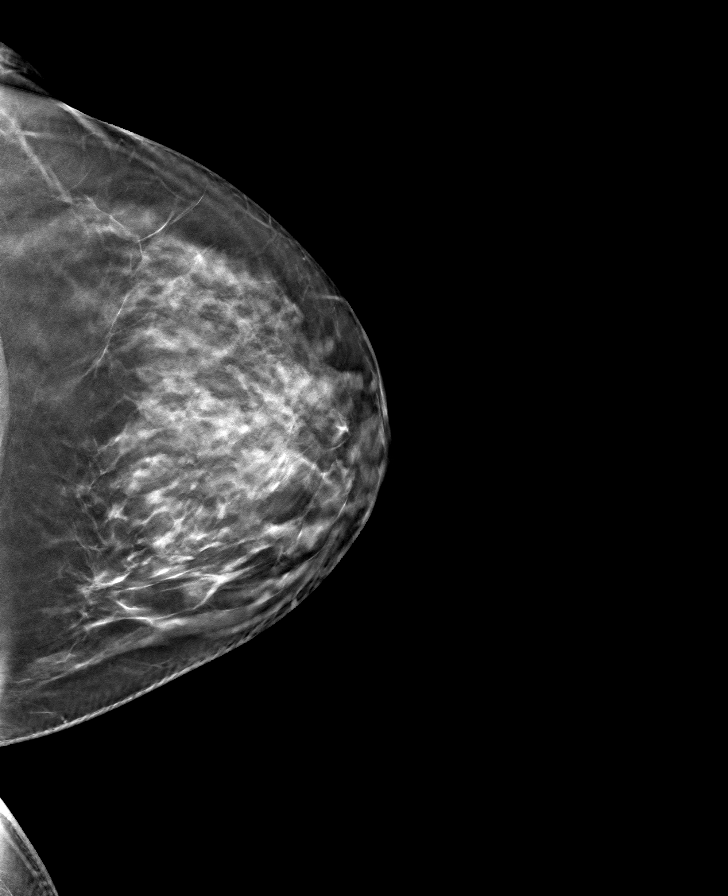

[L MLO tomo · tomo slice 46/91.0]
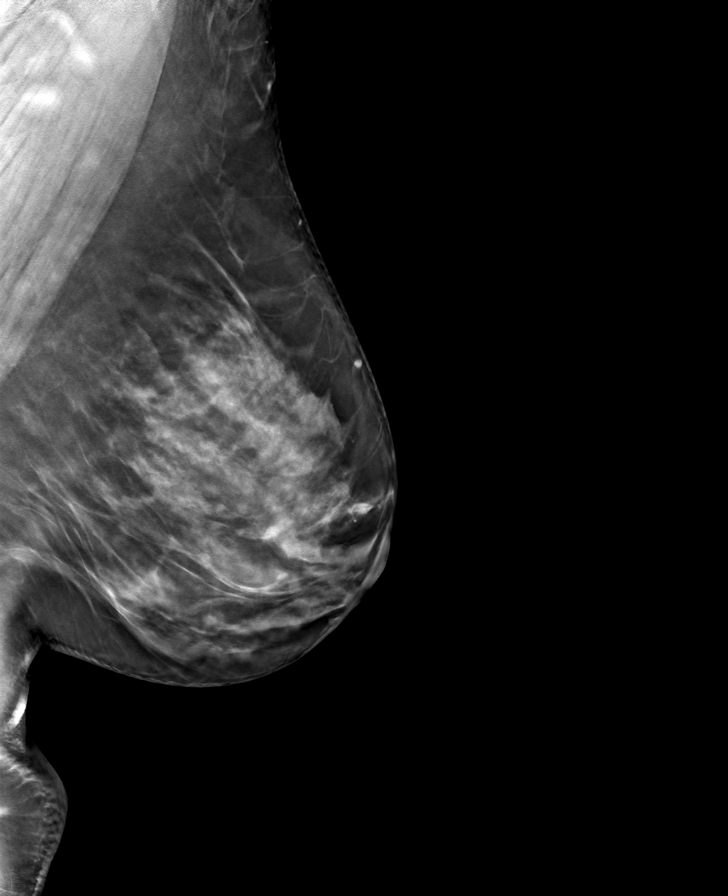

[R MLO tomo · tomo slice 43/85.0]
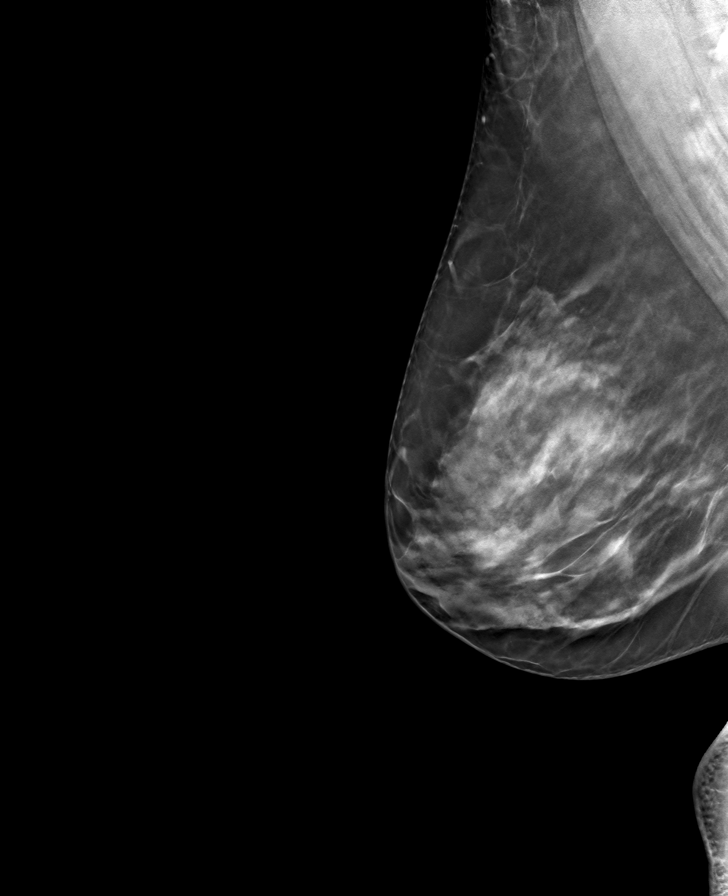

[R CC tomo · tomo slice 38/75.0]
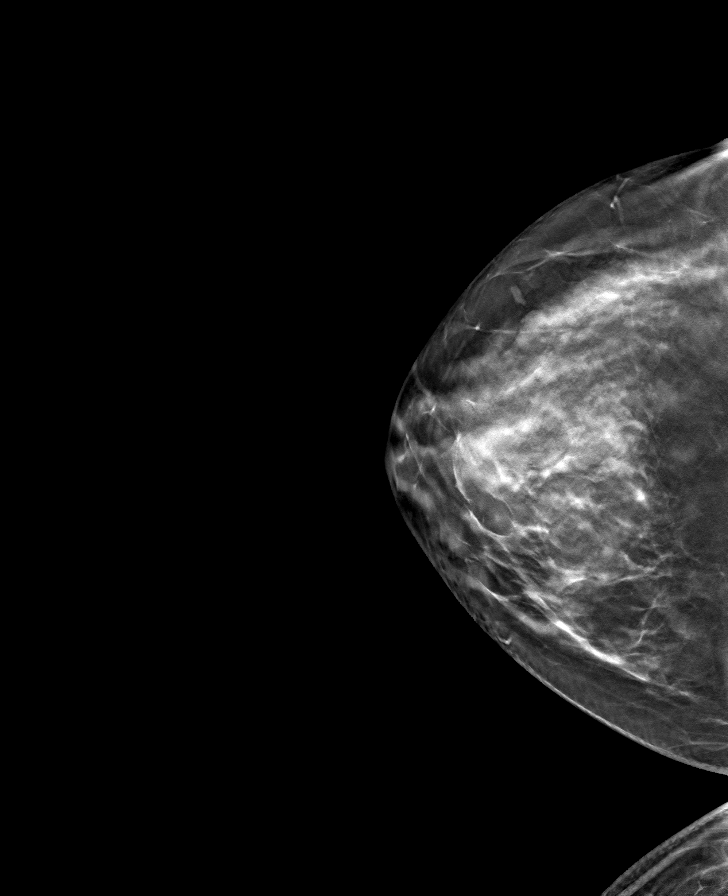

[8 of 24 positions shown; findings below may reference images not displayed]

ACR Breast Density Category c: The breast tissue is heterogeneously
dense, which may obscure small masses.
FINDINGS: There are no findings suspicious for malignancy.
IMPRESSION: No mammographic evidence of malignancy. A result letter of this
screening mammogram will be mailed directly to the patient.

RECOMMENDATION:
Screening mammogram in one year. (Code:Q3-W-BC3)

BI-RADS CATEGORY  1: Negative.

## 2024-02-02 ENCOUNTER — Ambulatory Visit: Payer: BC Managed Care – PPO | Admitting: Dermatology

## 2024-05-09 ENCOUNTER — Ambulatory Visit

## 2024-05-09 DIAGNOSIS — L719 Rosacea, unspecified: Secondary | ICD-10-CM

## 2024-05-09 MED ORDER — IVERMECTIN 1 % EX CREA
1.0000 | TOPICAL_CREAM | Freq: Every day | CUTANEOUS | 11 refills | Status: AC
Start: 2024-05-09 — End: ?

## 2024-05-09 NOTE — Progress Notes (Signed)
    Subjective   Jill Garrett is a 43 y.o. female who presents for the following: Rosacea. Patient is established patient   Today patient reports: Patient has stopped Clobetasol  and Doxycycline ; states that rosacea is well controled with Ivermectin  1% cream alone.   Review of Systems:    No other skin or systemic complaints except as noted in HPI or Assessment and Plan.  The following portions of the chart were reviewed this encounter and updated as appropriate: medications, allergies, medical history  Relevant Medical History:  n/a   Objective  Well appearing patient in no apparent distress; mood and affect are within normal limits. Examination was performed of the: Sun Exposed Exam: Scalp, head, eyes, ears, nose, lips, neck, upper extremities, hands, fingers, fingernails  Examination notable for: clear today  Examination limited by: Clothing and Patient deferred removal       Assessment & Plan   Rosacea Chronic and persistent condition with duration or expected duration over one year. Condition is stable and at treatment goal.   - Reviewed etiology and probable recurrent nature of this condition - Continue Ivermectin  Cream; apply topically at bedtime.  - Continue doxy prn for flares  - Sun avoidance, protective clothing sunscreen discussed - Telangiectasias will likely not fade completely, laser removal may be considered as a cosmetic procedure  Level of service outlined above   Procedures, orders, diagnosis for this visit:  ROSACEA   Related Medications doxycycline  (ORACEA ) 40 MG capsule Take 1 capsule (40 mg total) by mouth every morning. Ivermectin  (SOOLANTRA ) 1 % CREA Apply 1 application  topically at bedtime.  Rosacea -     Ivermectin ; Apply 1 application  topically at bedtime.  Dispense: 45 g; Refill: 11    Return to clinic: Return in about 1 year (around 05/09/2025) for Rosacea .  Documentation: I, Emerick Ege, CMA am acting as scribe for Lauraine JAYSON Kanaris, MD   I have reviewed the above documentation for accuracy and completeness, and I agree with the above.  Lauraine JAYSON Kanaris, MD

## 2024-05-09 NOTE — Patient Instructions (Addendum)
 Your prescription was sent to [facility] in [place]. A representative from [facility] will contact you within 3 business hours to verify your address and insurance information to schedule a free delivery. If for any reason you do not receive a phone call from them, please reach out to them. Their phone number is [phone] and their hours are Monday-Friday 9:00 am-5:00 pm.     Due to recent changes in healthcare laws, you may see results of your pathology and/or laboratory studies on MyChart before the doctors have had a chance to review them. We understand that in some cases there may be results that are confusing or concerning to you. Please understand that not all results are received at the same time and often the doctors may need to interpret multiple results in order to provide you with the best plan of care or course of treatment. Therefore, we ask that you please give us  2 business days to thoroughly review all your results before contacting the office for clarification. Should we see a critical lab result, you will be contacted sooner.   If You Need Anything After Your Visit  If you have any questions or concerns for your doctor, please call our main line at [phone] and press option 4 to reach your doctor's medical assistant. If no one answers, please leave a voicemail as directed and we will return your call as soon as possible. Messages left after 4 pm will be answered the following business day.   You may also send us  a message via MyChart. We typically respond to MyChart messages within 1-2 business days.  For prescription refills, please ask your pharmacy to contact our office. Our fax number is [phone].  If you have an urgent issue when the clinic is closed that cannot wait until the next business day, you can page your doctor at the number below.    Please note that while we do our best to be available for urgent issues outside of office hours, we are not  available 24/7.   If you have an urgent issue and are unable to reach us , you may choose to seek medical care at your doctor's office, retail clinic, urgent care center, or emergency room.  If you have a medical emergency, please immediately call 911 or go to the emergency department.  Pager Numbers  - Dr. Hester: [phone]  - Dr. Jackquline: [phone]  - Dr. Claudene: [phone]   - Dr. Raymund: [phone]  In the event of inclement weather, please call our main line at [phone] for an update on the status of any delays or closures.  Dermatology Medication Tips: Please keep the boxes that topical medications come in in order to help keep track of the instructions about where and how to use these. Pharmacies typically print the medication instructions only on the boxes and not directly on the medication tubes.   If your medication is too expensive, please contact our office at [phone] option 4 or send us  a message through MyChart.   We are unable to tell what your co-pay for medications will be in advance as this is different depending on your insurance coverage. However, we may be able to find a substitute medication at lower cost or fill out paperwork to get insurance to cover a needed medication.   If a prior authorization is required to get your medication covered by your insurance company, please allow us  1-2 business days to complete this process.  Drug prices often vary depending  on where the prescription is filled and some pharmacies may offer cheaper prices.  The website www.goodrx.com contains coupons for medications through different pharmacies. The prices here do not account for what the cost may be with help from insurance (it may be cheaper with your insurance), but the website can give you the price if you did not use any insurance.  - You can print the associated coupon and take it with your prescription to the pharmacy.  - You may also stop by our office  during regular business hours and pick up a GoodRx coupon card.  - If you need your prescription sent electronically to a different pharmacy, notify our office through [facility] or by phone at [phone] option 4.     Si Usted Necesita Algo Despus de Su Visita  Tambin puede enviarnos un mensaje a travs de Clinical cytogeneticist. Por lo general respondemos a los mensajes de MyChart en el transcurso de 1 a 2 das hbiles.  Para renovar recetas, por favor pida a su farmacia que se ponga en contacto con nuestra oficina. Randi lakes de fax es [place] [phone].  Si tiene un asunto urgente cuando la clnica est cerrada y que no puede esperar hasta el siguiente da hbil, puede llamar/localizar a su doctor(a) al nmero que aparece a continuacin.   Por favor, tenga en cuenta que aunque hacemos todo lo posible para estar disponibles para asuntos urgentes fuera del horario de [place], no estamos disponibles las 24 horas del da, los 7 [address]  [address] de la [place].   Si tiene un problema urgente y no puede comunicarse con nosotros, puede optar por buscar atencin mdica  en el consultorio de su doctor(a), en una clnica privada, en un centro de atencin urgente o en una sala de emergencias.  Si tiene Engineer, drilling, por favor llame inmediatamente al 911 o vaya a la sala de emergencias.  Nmeros de bper  - Dr. Hester: [phone]  - Dra. Jackquline: [phone]  - Dr. Claudene: [phone]  - Dra. Kitts: [phone]  En caso de inclemencias del [place], por favor llame a nuestra lnea principal al [phone] para una actualizacin sobre el [place] de cualquier retraso o cierre.  Consejos para la medicacin en dermatologa: Por favor, guarde las cajas en las que vienen los medicamentos de uso tpico para ayudarle a seguir las instrucciones sobre dnde y cmo usarlos. Las farmacias generalmente imprimen las instrucciones del medicamento slo en las cajas y no directamente en los tubos del  [place].   Si su medicamento es muy caro, por favor, pngase en contacto con landry rieger llamando al [phone] y presione la opcin 4 o envenos un mensaje a travs de Clinical cytogeneticist.   No podemos decirle cul ser su copago por los medicamentos por adelantado ya que esto es diferente dependiendo de la cobertura de su seguro. Sin embargo, es posible que podamos encontrar un medicamento sustituto a Audiological scientist un formulario para que el seguro cubra el medicamento que se considera necesario.   Si se requiere una autorizacin previa para que su compaa de seguros [place] su medicamento, por favor permtanos de 1 a 2 das hbiles para completar este proceso.  Los precios de los medicamentos varan con frecuencia dependiendo del Environmental consultant de dnde se surte la receta y alguna farmacias pueden ofrecer precios ms baratos.  El sitio web www.goodrx.com tiene cupones para medicamentos de Health and safety inspector. Los precios aqu no tienen en cuenta lo que podra costar con la ayuda del seguro (puede ser ms  barato con su seguro), pero el sitio web puede darle el precio si no Visual merchandiser.  - Puede imprimir el cupn correspondiente y llevarlo con su receta a la farmacia.  - Tambin puede pasar por nuestra oficina durante el horario de atencin regular y Education officer, museum una tarjeta de cupones de GoodRx.  - Si necesita que su receta se enve electrnicamente a una farmacia diferente, informe a nuestra oficina a travs de MyChart de [place] o por telfono llamando al [phone] y presione la opcin 4.

## 2024-05-16 ENCOUNTER — Telehealth: Payer: Self-pay

## 2024-05-16 NOTE — Telephone Encounter (Signed)
 This patient called left message on VM requesting a provider appeal for [employer] 

## 2024-07-16 ENCOUNTER — Encounter: Payer: Self-pay | Admitting: *Deleted

## 2024-07-16 ENCOUNTER — Other Ambulatory Visit: Payer: Self-pay

## 2024-07-16 NOTE — ED Triage Notes (Signed)
 Pt says she cut her right hand thumb with a grater tonight, occurred around 2030, last tetanus 3 years ago. Compression dressing placed in triage.

## 2024-07-17 ENCOUNTER — Emergency Department
Admission: EM | Admit: 2024-07-17 | Discharge: 2024-07-17 | Disposition: A | Discharge status: Home / Self Care | Attending: Emergency Medicine | Admitting: Emergency Medicine

## 2024-07-17 DIAGNOSIS — S61011A Laceration without foreign body of right thumb without damage to nail, initial encounter: Secondary | ICD-10-CM

## 2024-07-17 LAB — CBC WITH DIFFERENTIAL/PLATELET
Abs Immature Granulocytes: 0.06 K/uL (ref 0.00–0.07)
Basophils Absolute: 0 K/uL (ref 0.0–0.1)
Basophils Relative: 0 %
Eosinophils Absolute: 0.4 K/uL (ref 0.0–0.5)
Eosinophils Relative: 3 %
HCT: 43.2 % (ref 36.0–46.0)
Hemoglobin: 14 g/dL (ref 12.0–15.0)
Immature Granulocytes: 0 %
Lymphocytes Relative: 20 %
Lymphs Abs: 3 K/uL (ref 0.7–4.0)
MCH: 28.5 pg (ref 26.0–34.0)
MCHC: 32.4 g/dL (ref 30.0–36.0)
MCV: 87.8 fL (ref 80.0–100.0)
Monocytes Absolute: 0.9 K/uL (ref 0.1–1.0)
Monocytes Relative: 6 %
Neutro Abs: 10.9 K/uL — ABNORMAL HIGH (ref 1.7–7.7)
Neutrophils Relative %: 71 %
Platelets: 294 K/uL (ref 150–400)
RBC: 4.92 MIL/uL (ref 3.87–5.11)
RDW: 13.2 % (ref 11.5–15.5)
WBC: 15.2 K/uL — ABNORMAL HIGH (ref 4.0–10.5)
nRBC: 0 % (ref 0.0–0.2)

## 2024-07-17 LAB — COMPREHENSIVE METABOLIC PANEL WITH GFR
ALT: 13 U/L (ref 0–44)
AST: 14 U/L — ABNORMAL LOW (ref 15–41)
Albumin: 4.4 g/dL (ref 3.5–5.0)
Alkaline Phosphatase: 98 U/L (ref 38–126)
Anion gap: 11 (ref 5–15)
BUN: 17 mg/dL (ref 6–20)
CO2: 23 mmol/L (ref 22–32)
Calcium: 9.7 mg/dL (ref 8.9–10.3)
Chloride: 104 mmol/L (ref 98–111)
Creatinine, Ser: 0.78 mg/dL (ref 0.44–1.00)
GFR, Estimated: >60 mL/min (ref >60)
Glucose, Bld: 118 mg/dL — ABNORMAL HIGH (ref 70–99)
Potassium: 4.2 mmol/L (ref 3.5–5.1)
Sodium: 138 mmol/L (ref 135–145)
Total Bilirubin: 0.3 mg/dL (ref 0.0–1.2)
Total Protein: 7.6 g/dL (ref 6.5–8.1)

## 2024-07-17 MED ORDER — ACETAMINOPHEN 500 MG PO TABS
1000.0000 mg | ORAL_TABLET | Freq: Once | ORAL | Status: AC
  Administered 2024-07-17: 1000 mg via ORAL
  Filled 2024-07-17: qty 2

## 2024-07-17 MED ORDER — TETANUS-DIPHTH-ACELL PERTUSSIS 5-2-15.5 LF-MCG/0.5 IM SUSP
0.5000 mL | Freq: Once | INTRAMUSCULAR | Status: AC
  Administered 2024-07-17: 0.5 mL via INTRAMUSCULAR
  Filled 2024-07-17: qty 0.5

## 2024-07-17 NOTE — ED Provider Notes (Signed)
 [facility] Provider Note    Event Date/Time   First MD Initiated Contact with Patient 07/17/24 0118     (approximate)   History   Laceration   HPI  Jill Garrett is a 43 y.o. female who comes in with wound to her right hand thumb.  She reports cutting her thumb with a cheese grater tonight.  Compressive dressing was in place.  She reports trying to hold pressure for 2 hours and the bleeding was not stopping.  She denies having a laceration more of just an abrasion denies any injury to her nail.  Otherwise she does not feel like she broke her finger and denies any pain or discomfort.  Last Tdap was July 2020 therefore we will update   Physical Exam   Triage Vital Signs: ED Triage Vitals  Encounter Vitals Group     BP 07/16/24 2235 (!) 159/90     Girls Systolic BP Percentile --      Girls Diastolic BP Percentile --      Boys Systolic BP Percentile --      Boys Diastolic BP Percentile --      Pulse Rate 07/16/24 2235 89     Resp 07/16/24 2235 16     Temp 07/16/24 2235 98.8 F (37.1 C)     Temp Source 07/16/24 2235 Oral     SpO2 07/16/24 2235 99 %     Weight 07/16/24 2235 220 lb (99.8 kg)     Height 07/16/24 2235 5' 6 (1.676 m)     Head Circumference --      Peak Flow --      Pain Score 07/16/24 2243 2     Pain Loc --      Pain Education --      Exclude from Growth Chart --     Most recent vital signs: Vitals:   07/16/24 2235  BP: (!) 159/90  Pulse: 89  Resp: 16  Temp: 98.8 F (37.1 C)  SpO2: 99%     General: Awake, no distress.  CV:  Good peripheral perfusion.  Resp:  Normal effort.  Abd:  No distention.  Other:  Patient has abrasion noted to the right thumb.  No issue with the nail itself.  No nail hematoma.  No laceration of the nail.  She has venous bleeding noted from the abraised area.  It is superficial in nature.   ED Results / Procedures / Treatments   Labs (all labs ordered are listed, but only abnormal results are  displayed) Labs Reviewed  CBC WITH DIFFERENTIAL/PLATELET - Abnormal; Notable for the following components:      Result Value   WBC 15.2 (*)    Neutro Abs 10.9 (*)    All other components within normal limits  COMPREHENSIVE METABOLIC PANEL WITH GFR - Abnormal; Notable for the following components:   Glucose, Bld 118 (*)    AST 14 (*)    All other components within normal limits     EKG  My interpretation of EKG:    PROCEDURES:  Critical Care performed: No  .Laceration Repair  Date/Time: 07/17/2024 3:26 AM  Performed by: Ernest Ronal BRAVO, MD Authorized by: Ernest Ronal BRAVO, MD   Consent:    Consent obtained:  Verbal   Consent given by:  Patient   Risks discussed:  Infection, poor cosmetic result and pain   Alternatives discussed:  No treatment Laceration details:    Location:  Finger   Finger location:  R thumb   Length (cm):  1   Depth (mm):  1 Treatment:    Area cleansed with:  Saline and povidone-iodine Repair type:    Repair type:  Simple Comments:     Patient have abraised area.  Unable to suture it given it was just an abrasion but did have significant venous bleeding that did not get better with pressure therefore Surgicel was placed and pressure dressing was placed on top.  I did take this down in the area with still oozing a little bit therefore additional Surgicel placed on top.  Pressure dressing was then taken down again and the area of bleeding had subsided.  I did place some Xeroform on top of it to help with sticking and then placed a compressive bandage on top.  Patient denied feeling too tight to suggest decreased blood flow difficult to get cap refill given the area that was dressed covered over the nailbed but she reports having sensation in her finger and otherwise feeling okay.    MEDICATIONS ORDERED IN ED: Medications  Tdap (ADACEL ) injection 0.5 mL (has no administration in time range)  acetaminophen  (TYLENOL ) tablet 1,000 mg (has no administration  in time range)     IMPRESSION / MDM / ASSESSMENT AND PLAN / ED COURSE  I reviewed the triage vital signs and the nursing notes.   Patient's presentation is most consistent with acute, uncomplicated illness.   Patient comes in with concerns for abrasion to the finger.  Mechanism does not suggest fracture or retained foreign body.  Very superficial abrasion noted that was washed out and dealt with as above.  Blood work was ordered without any evidence of liver failure, anemia, low platelets.  Patient will be following up with her primary care doctor for wound check versus following up with the hand surgeon if her primary care doctor does not feel comfortable managing this. They are going to take down the compressive bandage in 24 hours and understand not to remove the Surgicel and have this followed up with either hand surgeon or primary care doctor for evaluation.  FINAL CLINICAL IMPRESSION(S) / ED DIAGNOSES   Final diagnoses:  Laceration of right thumb, foreign body presence unspecified, nail damage status unspecified, initial encounter     Rx / DC Orders   ED Discharge Orders     None        Note:  This document was prepared using Dragon voice recognition software and may include unintentional dictation errors.   Ernest Ronal BRAVO, MD 07/17/24 [phone]

## 2024-07-17 NOTE — Discharge Instructions (Signed)
 Have this area evaluated on Tuesday or Wednesday.  You can either follow-up with the primary care doctor or if they do not feel comfortable with the hand surgeon.  Please call them to make an appointment.  Return for fevers, worsening symptoms, recurrent bleeding or any other concerns

## 2024-08-05 ENCOUNTER — Other Ambulatory Visit: Payer: Self-pay | Admitting: Internal Medicine

## 2024-08-05 DIAGNOSIS — Z1231 Encounter for screening mammogram for malignant neoplasm of breast: Secondary | ICD-10-CM

## 2024-08-31 ENCOUNTER — Ambulatory Visit
Admission: RE | Admit: 2024-08-31 | Discharge: 2024-08-31 | Disposition: A | Discharge status: Home / Self Care | Source: Ambulatory Visit | Attending: Internal Medicine | Admitting: Internal Medicine

## 2024-08-31 DIAGNOSIS — Z1231 Encounter for screening mammogram for malignant neoplasm of breast: Secondary | ICD-10-CM | POA: Diagnosis present

## 2025-05-10 ENCOUNTER — Ambulatory Visit
